# Patient Record
Sex: Male | Born: 1995 | Race: White | Hispanic: No | Marital: Single | Smoking: Never smoker
Health system: Southern US, Community
[De-identification: ages and names within clinical notes are randomized; demographics above are authoritative.]

## PROBLEM LIST (undated history)

## (undated) DIAGNOSIS — J301 Allergic rhinitis due to pollen: Secondary | ICD-10-CM

## (undated) HISTORY — DX: Allergic rhinitis due to pollen: J30.1

## (undated) HISTORY — PX: NO PAST SURGERIES: SHX2092

---
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
## 2014-09-21 IMAGING — CR DG FOOT COMPLETE 3+V*R*
3 series · 3 of 3 positions shown · non-contrast
Comparison: None.

CLINICAL DATA: Foot pain

RIGHT FOOT COMPLETE - 3+ VIEW

[view not recorded (1 of 3)]
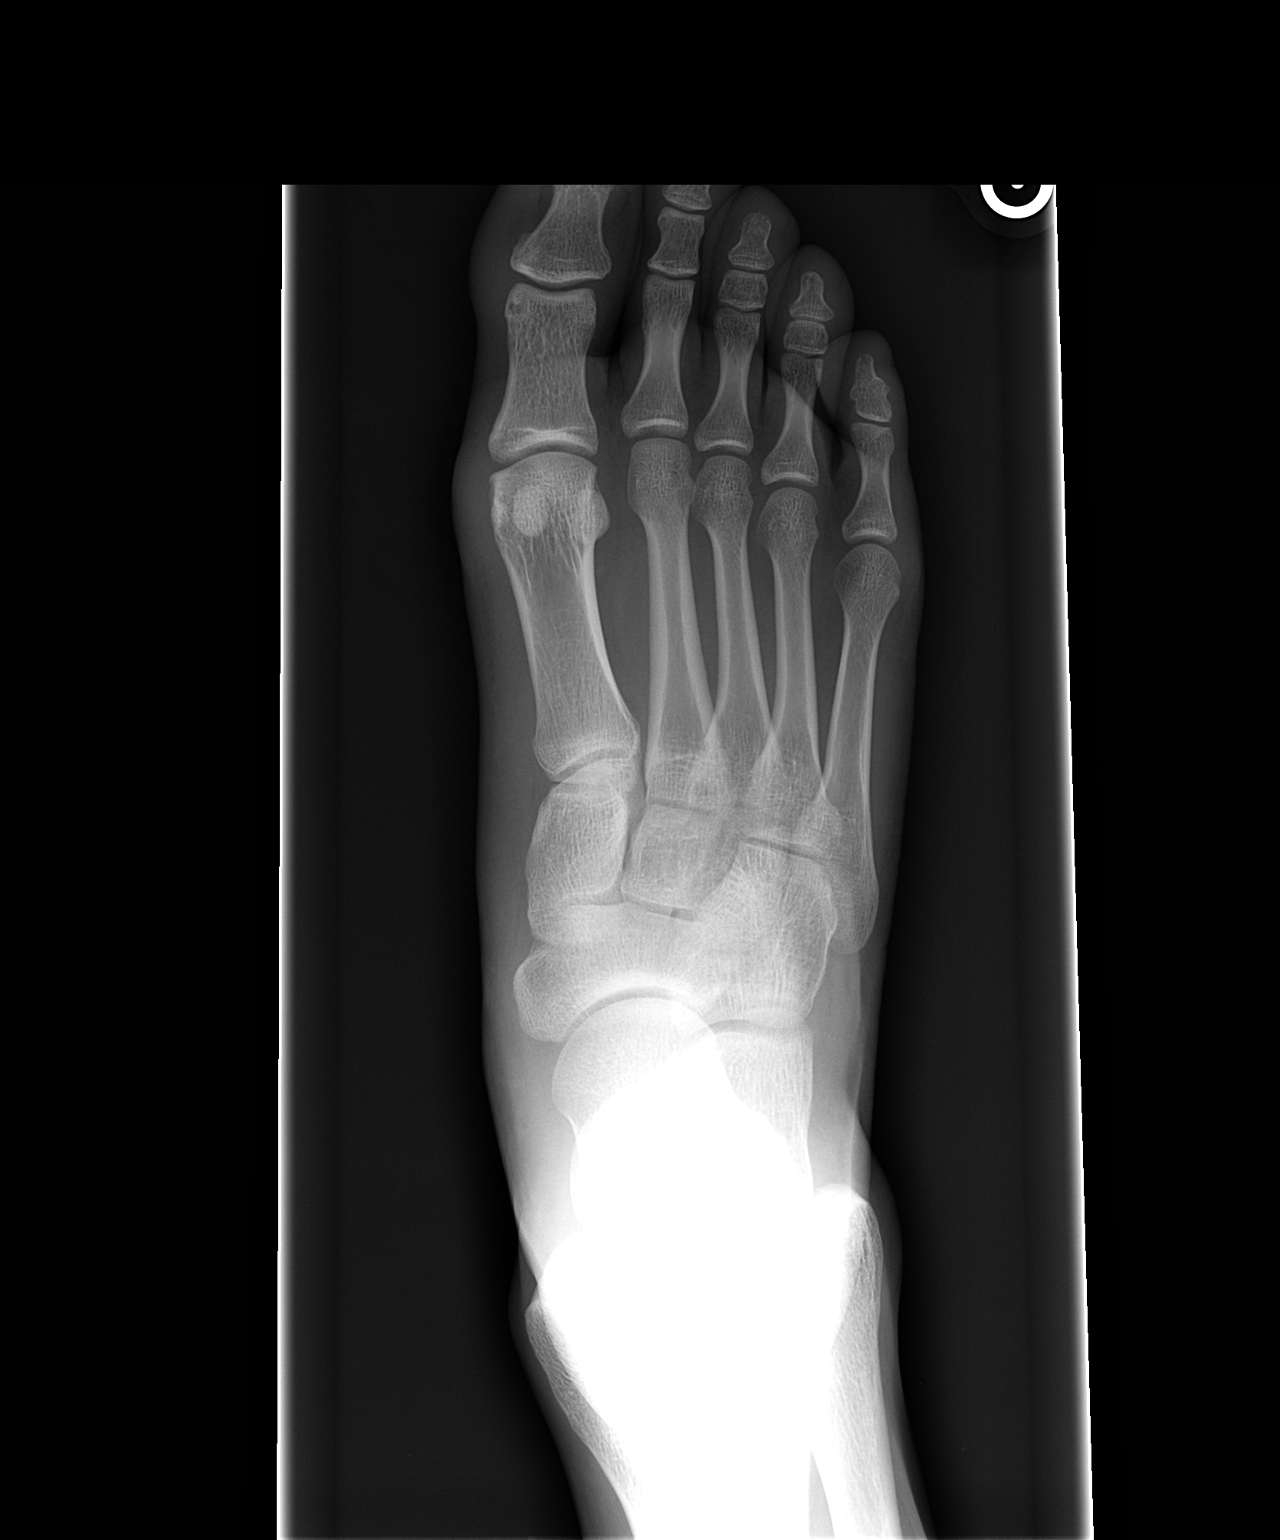

[view not recorded (2 of 3)]
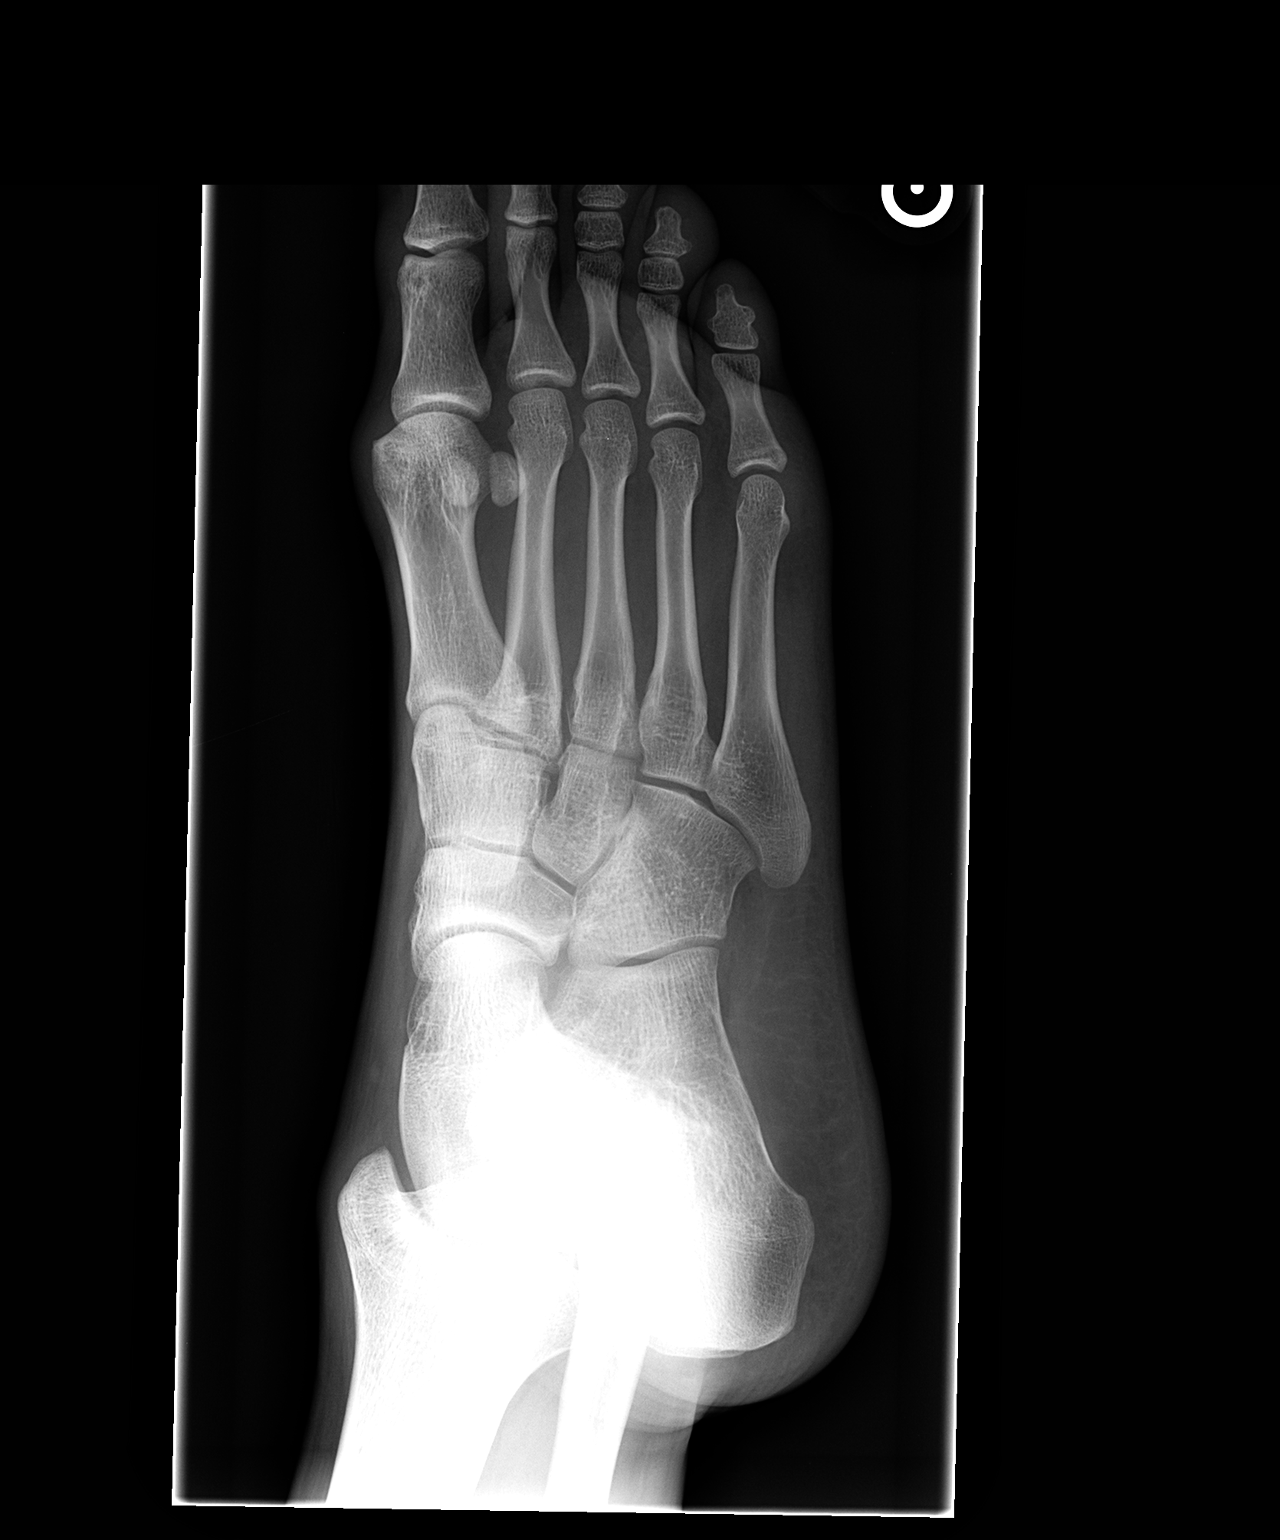

[view not recorded (3 of 3)]
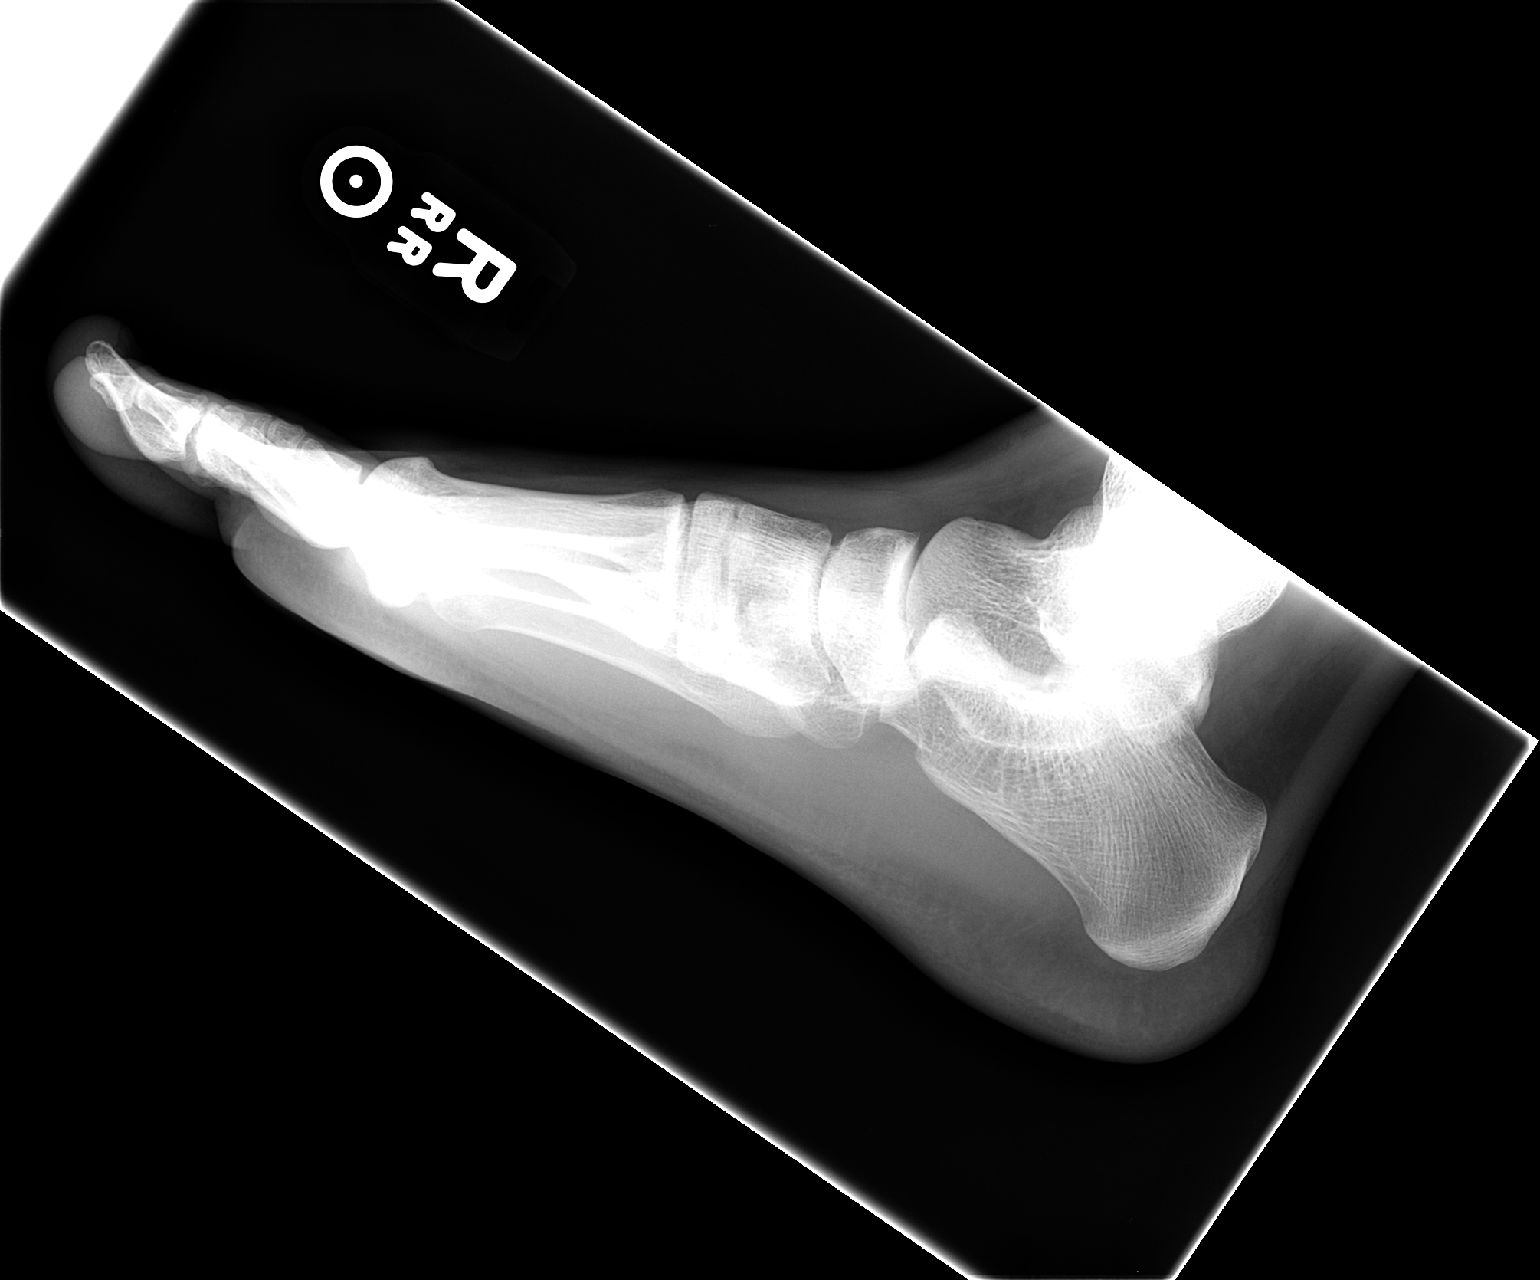

[3 of 3 positions shown; findings below may reference images not displayed]

FINDINGS: Tarsal - metatarsal alignment is normal.  No acute
fracture is seen.  Joint spaces appear normal.  Mild soft tissue
swelling is noted adjacent to the right first MTP joint medially.
IMPRESSION: No acute bony abnormality.  Mild soft tissue swelling medial to the
right first MTP joint.

## 2014-09-21 IMAGING — CR DG FINGER LITTLE 2+V*R*
1 series · 1 of 1 positions shown · non-contrast
Comparison: None.

CLINICAL DATA: Pain post trauma

[view not recorded]
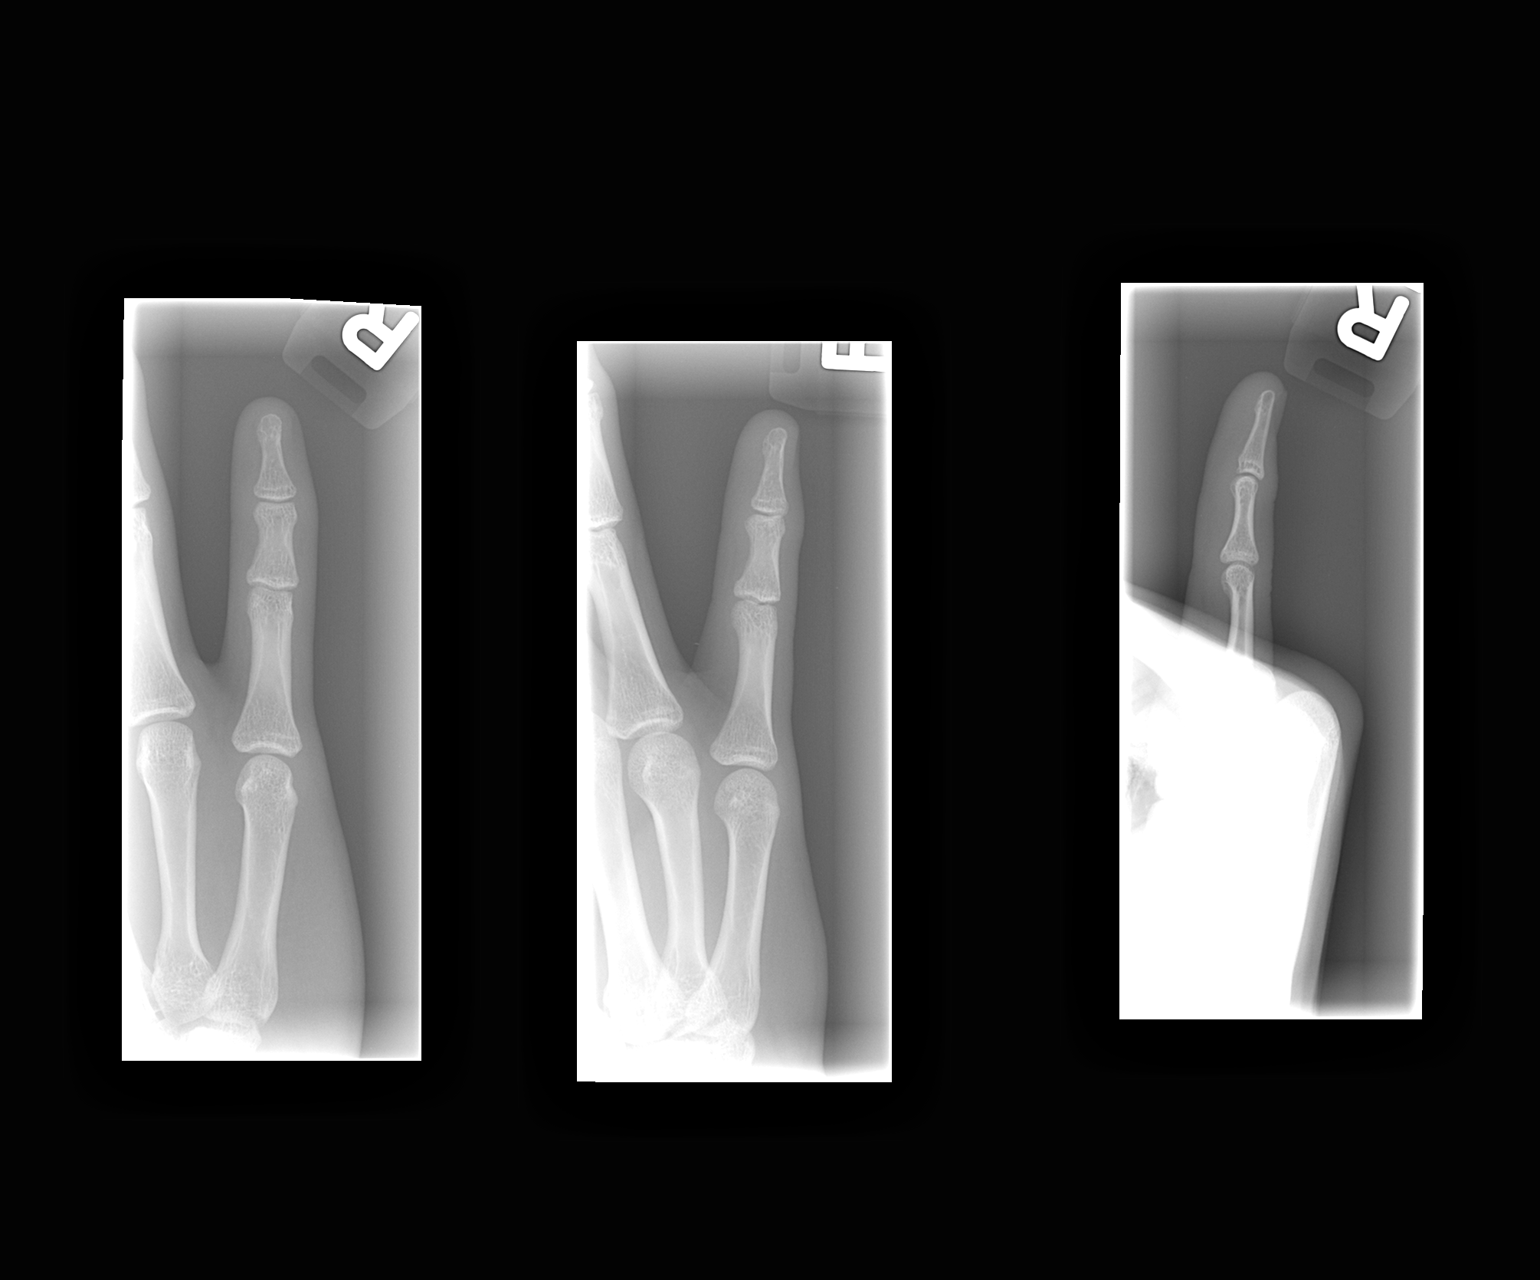

[1 of 1 positions shown; findings below may reference images not displayed]

FINDINGS: Frontal, oblique, and lateral views were obtained.  There
is no fracture or dislocation.  Joint spaces appear intact.  No
erosive change.
IMPRESSION: No abnormality noted.

## 2015-03-17 ENCOUNTER — Ambulatory Visit (INDEPENDENT_AMBULATORY_CARE_PROVIDER_SITE_OTHER): Payer: PRIVATE HEALTH INSURANCE | Admitting: Family Medicine

## 2015-03-17 ENCOUNTER — Encounter: Payer: Self-pay | Admitting: Family Medicine

## 2015-03-17 VITALS — BP 104/66 | HR 74 | Temp 98.5°F | Ht 66.5 in | Wt 147.0 lb

## 2015-03-17 DIAGNOSIS — J302 Other seasonal allergic rhinitis: Secondary | ICD-10-CM

## 2015-03-17 DIAGNOSIS — H6982 Other specified disorders of Eustachian tube, left ear: Secondary | ICD-10-CM

## 2015-03-17 DIAGNOSIS — H6980 Other specified disorders of Eustachian tube, unspecified ear: Secondary | ICD-10-CM | POA: Insufficient documentation

## 2015-03-17 NOTE — Patient Instructions (Signed)
Try nasal steroid spray; 2 sprays each nostril once every morning.  Try OTC nonsedating antihistamine like claritin, zyrtec, or allegra.  Decongestants not recommended.  Blow against closed nostrils forcefully several times per day or as needed.

## 2015-03-17 NOTE — Progress Notes (Addendum)
Office Note 03/17/2015  CC:  Chief Complaint  Patient presents with  . Establish Care   HPI:  Thomas Melton is a 19 y.o. White male who is here to establish care. Patient's most recent primary MD: [facility] [place]. Old records were not reviewed prior to or during today's visit.  Left ear feels full, like it needs to pop, started last night.  Hearing diminished slightly as well. Over the last week has had more nasal congestion/runny nose, sneezing, itchy eyes. No cough or wheezing.  No fevers.  No ear pain.  Denies hx of similar ear problem. Past meds tried for allergic rhin sx's: claritin  Past Medical History  Diagnosis Date  . Hay fever     Just spring usually    History reviewed. No pertinent past surgical history.  History reviewed. No pertinent family history.  History   Social History  . Marital Status: Single    Spouse Name: N/A  . Number of Children: N/A  . Years of Education: N/A   Occupational History  . Not on file.   Social History Main Topics  . Smoking status: Never Smoker   . Smokeless tobacco: Never Used  . Alcohol Use: No  . Drug Use: Not on file  . Sexual Activity: Not on file   Other Topics Concern  . Not on file   Social History Narrative   Single, no children.   [employer] grad 2015.   [employer] student.     Works part time for [employer] loading trucks.   No T/A/Ds.   MEDS: none except as mentioned in HPI  No Known Allergies  ROS Review of Systems  Constitutional: Negative for fever and fatigue.  HENT:       See HPI  Eyes: Negative for pain, discharge, redness and visual disturbance.  Respiratory: Negative for cough.   Cardiovascular: Negative for chest pain.  Gastrointestinal: Negative for nausea and abdominal pain.  Genitourinary: Negative for dysuria.  Musculoskeletal: Negative for back pain and joint swelling.  Skin: Negative for rash.  Neurological: Negative for weakness and headaches.  Hematological: Negative for  adenopathy.    PE; Blood pressure 104/66, pulse 74, temperature 98.5 F (36.9 C), temperature source Temporal, height 5' 6.5" (1.689 m), weight 147 lb (66.679 kg), SpO2 98 %. Gen: Alert, well appearing.  Patient is oriented to person, place, time, and situation. ENT: Ears: EACs clear, normal epithelium.  TMs with good light reflex and landmarks bilaterally.  Eyes: no injection, icteris, swelling, or exudate.  EOMI, PERRLA. Nose: no drainage or turbinate edema/swelling.  No injection or focal lesion.  Mouth: lips without lesion/swelling.  Oral mucosa pink and moist.  Dentition intact and without obvious caries or gingival swelling.  Oropharynx without erythema, exudate, or swelling.  Neck - No masses or thyromegaly or limitation in range of motion CV: RRR, no m/r/g.   LUNGS: CTA bilat, nonlabored resps, good aeration in all lung fields.  Pertinent labs:  none  ASSESSMENT AND PLAN:   New pt; obtain old shot records.  1) Eustachian tube dysfunction; secondary to his seasonal allergic rhinitis sx's of late. Discussed dx with pt, answered questions. Instructions: Try nasal steroid spray; 2 sprays each nostril once every morning (he says his dad has some at home so he declined a rx for any meds today).  Try OTC nonsedating antihistamine like claritin, zyrtec, or allegra.  Decongestants not recommended.  Blow against closed nostrils forcefully several times per day or as needed.  An After Visit Summary was printed and given to the patient.  F/u prn.

## 2015-03-17 NOTE — Progress Notes (Signed)
Pre visit review using our clinic review tool, if applicable. No additional management support is needed unless otherwise documented below in the visit note. 

## 2017-04-22 ENCOUNTER — Telehealth: Payer: Self-pay | Admitting: Family Medicine

## 2017-04-22 NOTE — Telephone Encounter (Signed)
Patient's mother calling on behalf of pt to request to switch pcp from [facility] to [facility].  Dr. Claiborne Billings sees other family members.

## 2017-04-22 NOTE — Telephone Encounter (Signed)
Ok with me 

## 2017-04-22 NOTE — Telephone Encounter (Signed)
OK with me.

## 2017-04-23 ENCOUNTER — Ambulatory Visit: Payer: PRIVATE HEALTH INSURANCE | Admitting: Family Medicine

## 2017-04-25 ENCOUNTER — Ambulatory Visit (INDEPENDENT_AMBULATORY_CARE_PROVIDER_SITE_OTHER): Payer: PRIVATE HEALTH INSURANCE | Admitting: Family Medicine

## 2017-04-25 ENCOUNTER — Encounter: Payer: Self-pay | Admitting: Family Medicine

## 2017-04-25 VITALS — BP 138/77 | HR 84 | Temp 97.8°F | Resp 20 | Ht 67.0 in | Wt 155.5 lb

## 2017-04-25 DIAGNOSIS — F418 Other specified anxiety disorders: Secondary | ICD-10-CM | POA: Diagnosis not present

## 2017-04-25 DIAGNOSIS — F39 Unspecified mood [affective] disorder: Secondary | ICD-10-CM | POA: Diagnosis not present

## 2017-04-25 MED ORDER — FLUOXETINE HCL 20 MG PO TABS
20.0000 mg | ORAL_TABLET | Freq: Every day | ORAL | 0 refills | Status: DC
Start: 2017-04-25 — End: 2017-07-19

## 2017-04-25 NOTE — Progress Notes (Signed)
Patient ID: Coda Mathey, male  DOB: May 30, 1996, 21 y.o.   MRN: [id number] Patient Care Team    Relationship Specialty Notifications Start End  Natalia Leatherwood, DO PCP - General Family Medicine  04/23/17     Chief Complaint  Patient presents with  . Anxiety    Subjective:  Eddrick Dilone is a 21 y.o.  male present for new patient establishment (transfer between providers). All past medical history, surgical history, allergies, family history, immunizations, medications and social history were obtained in the electronic medical record today. All recent labs, ED visits and hospitalizations within the last year were reviewed.  Anxiety: Patient reports he is having a hard time "shutting [his] brain off. "He is having a hard time finishing thoughts, concentrating and focusing. He feels over the last few month he started to have signs of depression. He reports he has always been mildly anxious, but never to this level, and never needed medication to help him. The depression symptoms are new for him. He is sometimes happy, but usually not feeling motivated, sad and down on himself. He just finished his junior year at [employer], studying business administration. He works at [employer] as well. He had roommates, but recently moved home with his parents. He reports it is a "better environment "for him. His grades have always been pretty good until this past year. He states his grades are slipping, and he started using marijuana and cocaine. He moved to have better support to quit all of the drugs. He started seeing a Veterinary surgeon. His family is supportive. He denies any known family history of psychiatric disorders. He denies SI or HI. He has difficulty sleeping, sometimes only having 3-6 hours of sleep a night.  Mood disorder screening: Positive #1- 12 checked yes. #2- yes #3- serious problem #4 and #5- he is uncertain  Depression screen PHQ 2/9 04/25/2017  Decreased Interest 2  Down, Depressed,  Hopeless 2  PHQ - 2 Score 4  Altered sleeping 2  Tired, decreased energy 3  Change in appetite 2  Feeling bad or failure about yourself  2  Trouble concentrating 3  Moving slowly or fidgety/restless 1  Suicidal thoughts 0  PHQ-9 Score 17  -Very difficult for him to do his work, take care of things at home are good along with other people. GAD 7 : Generalized Anxiety Score 04/25/2017  Nervous, Anxious, on Edge 2  Control/stop worrying 3  Worry too much - different things 3  Trouble relaxing 1  Restless 1  Easily annoyed or irritable 2  Afraid - awful might happen 2  Total GAD 7 Score 14  Anxiety Difficulty Very difficult  - very difficult for him to do his work take care of things at home are good along with people.   No flowsheet data found.  There is no immunization history on file for this patient.  No exam data present  Past Medical History:  Diagnosis Date  . Hay fever    Just spring usually   No Known Allergies Past Surgical History:  Procedure Laterality Date  . NO PAST SURGERIES     No family history on file. Social History   Social History  . Marital status: Single    Spouse name: N/A  . Number of children: N/A  . Years of education: N/A   Occupational History  . Not on file.   Social History Main Topics  . Smoking status: Never Smoker  . Smokeless tobacco:  Never Used  . Alcohol use No  . Drug use: Yes    Types: Marijuana, Cocaine  . Sexual activity: Yes   Other Topics Concern  . Not on file   Social History Narrative   Single, no children.   [employer] student      Allergies as of 04/25/2017   No Known Allergies     Medication List       Accurate as of 04/25/17 12:28 PM. Always use your most recent med list.          FLUoxetine 20 MG tablet Commonly known as:  PROZAC Take 1 tablet (20 mg total) by mouth daily.       All past medical history, surgical history, allergies, family history, immunizations andmedications were updated in the  EMR today and reviewed under the history and medication portions of their EMR.    No results found for this or any previous visit (from the past 2160 hour(s)).  No results found.   ROS: 14 pt review of systems performed and negative (unless mentioned in an HPI)  Objective: BP 138/77 (BP Location: Right Arm, Patient Position: Sitting, Cuff Size: Normal)   Pulse 84   Temp 97.8 F (36.6 C)   Resp 20   Ht 5\' 7"  (1.702 m)   Wt 155 lb 8 oz (70.5 kg)   SpO2 99%   BMI 24.35 kg/m  Gen: Afebrile. No acute distress. Nontoxic in appearance, well-developed, well-nourished HENT: AT. [place]. MMM Eyes:Pupils Equal Round Reactive to light, Extraocular movements intact,  Conjunctiva without redness, discharge or icterus. Neck/lymp/endocrine: Supple, no thyromegaly CV: RRR no murmur, no edema, +2/4 P posterior tibialis pulses.  Chest: CTAB, no wheeze, rhonchi or crackles.  Skin:  Warm and well-perfused. Skin intact. Neuro/Msk: Normal gait. PERLA. EOMi. Alert. Oriented x3.  Psych: Quiet, mildly withdrawn, sad appearing. Normal speech tone and speed. Normal thought content and judgment. No SI or HI.   Assessment/plan: Keane Scraperistan Michelotti is a 21 y.o. male present for anxiety. Depression with anxiety Mood disorder [facility](HCC) - Discussed with patient in great detail his depression screening is significant for major depression. His anxiety screening is significant for moderate anxiety. His mood disorder screening is positive. These results were discussed with him in detail. He was allowed to ask questions, all which were answered. She declined any blood work to look for organic causes. - Multiple options of therapies were discussed with patient and it was decided to start Prozac 20 mg daily. Continue his counseling. He was offered referral to psychiatry which she declined at this time. He was provided with resources for 24-hour crisis line, other counselors and psychiatrists in the community. - It sounds as if his  family is supportive for him. - Follow-up in 3-4 weeks prior to needing refills, will taper at that time if needed. May need to consider adding trazodone to help with sleep.  Return in about 3 weeks (around 05/16/2017). > 25 minutes spent with patient, >50% of time spent face to face counseling and/or coordinating care.     Note is dictated utilizing voice recognition software. Although note has been proof read prior to signing, occasional typographical errors still can be missed. If any questions arise, please do not hesitate to call for verification.  Electronically signed by: Felix Pacinienee Rebekka Lobello, DO [facility] [place]

## 2017-04-25 NOTE — Patient Instructions (Addendum)
It was a pleasure meeting you today.   Start medicine today. Take in the morning routinely. Must be taken everyday.   Follow up in 3.5 weeks, before needing refills so we can talk again and see if we need to increase your dose.  Try to make a routine sleep schedule for yourself.   Continue counseling.     Major Depressive Disorder, Adult Major depressive disorder (MDD) is a mental health condition. MDD often makes you feel sad, hopeless, or helpless. MDD can also cause symptoms in your body. MDD can affect your:  Work.  School.  Relationships.  Other normal activities. MDD can range from mild to very bad. It may occur once (single episode MDD). It can also occur many times (recurrent MDD). The main symptoms of MDD often include:  Feeling sad, depressed, or irritable most of the time.  Loss of interest. MDD symptoms also include:  Sleeping too much or too little.  Eating too much or too little.  A change in your weight.  Feeling tired (fatigue) or having low energy.  Feeling worthless.  Feeling guilty.  Trouble making decisions.  Trouble thinking clearly.  Thoughts of suicide or harming others.  Feeling weak.  Feeling agitated.  Keeping yourself from being around other people (isolation). Follow these instructions at home: Activity   Do these things as told by your doctor:  Go back to your normal activities.  Exercise regularly.  Spend time outdoors. Alcohol   Talk with your doctor about how alcohol can affect your antidepressant medicines.  Do not drink alcohol. Or, limit how much alcohol you drink.  This means no more than 1 drink a day for nonpregnant women and 2 drinks a day for men. One drink equals one of these:  12 oz of beer.  5 oz of wine.  1 oz of hard liquor. General instructions   Take over-the-counter and prescription medicines only as told by your doctor.  Eat a healthy diet.  Get plenty of sleep.  Find activities that  you enjoy. Make time to do them.  Think about joining a support group. Your doctor may be able to suggest a group for you.  Keep all follow-up visits as told by your doctor. This is important. Where to find more information:   The [employer] Alliance on Mental Illness:  www.nami.org  U.S. [employer] Institute of Mental Health:  http://www.maynard.net/www.nimh.nih.gov  National Suicide Prevention Lifeline:  424-615-77421-8204100626. This is free, 24-hour help. Contact a doctor if:  Your symptoms get worse.  You have new symptoms. Get help right away if:  You self-harm.  You see, hear, taste, smell, or feel things that are not present (hallucinate). If you ever feel like you may hurt yourself or others, or have thoughts about taking your own life, get help right away. You can go to your nearest emergency department or call:  Your local emergency services (911 in the [place]  A suicide crisis helpline, such as the National Suicide Prevention Lifeline:  205-541-50671-8204100626. This is open 24 hours a day. This information is not intended to replace advice given to you by your health care provider. Make sure you discuss any questions you have with your health care provider. Document Released: 11/21/2015 Document Revised: 08/26/2016 Document Reviewed: 08/26/2016 Elsevier Interactive Patient Education  2017 [employer]

## 2017-05-31 ENCOUNTER — Ambulatory Visit: Payer: PRIVATE HEALTH INSURANCE | Admitting: Family Medicine

## 2017-07-19 ENCOUNTER — Telehealth: Payer: Self-pay | Admitting: Family Medicine

## 2017-07-19 MED ORDER — FLUOXETINE HCL 20 MG PO TABS: 20.0000 mg | ORAL_TABLET | Freq: Every day | ORAL | 0 refills | Status: DC

## 2017-07-19 NOTE — Telephone Encounter (Signed)
Patient was due for follow up around 05/16/17 and would have been out of medication at the end of May. Mother is requesting we refill this medication since he has been out of it and he cant be seen until Dr Claiborne BillingsKuneff back from vacation. Please advise

## 2017-07-19 NOTE — Telephone Encounter (Signed)
Please call pt: - he was asked to follow-up in 3-4 weeks, which would have been end of June. He has been out of this medication for 4 weeks. - I will call in 1 month again, because he really needs to be on this medication consecutively to understand if it is beneficial to him and if we need to alter the dose.  - I will only provide this courtesy once, he will need to follow up to safely keep administrating the medication

## 2017-07-19 NOTE — Telephone Encounter (Signed)
Spoke with patient mother she states patient had been on the medication and has only been out for 3 days [facility] but does not want to go back there. She wants to keep appt here 07/29/17 she states [facility] had increased his prozac to 40 mg daily but we had only Rx'd him at 20 mg daily so she will keep appt for evaluation .

## 2017-07-19 NOTE — Telephone Encounter (Signed)
Patient mother calling to schedule patient's follow-up appt that was not schedule before leaving the office during last ov.  Follow-up appt scheduled for 07/29/17, which is first available due to pcp being out of the office the week of 07/22/17.  Patient's mother is concerned with patient waiting so long to be seen since he has ran out of medication.  She reports patient has seen another provider [facility].  However, he does not want to see this provider again but rather prefers to see Dr. Claiborne BillingsKuneff.    She wants to know if patient can get refill of medication today or does this need to wait until appt on 8/6.

## 2017-07-29 ENCOUNTER — Encounter: Payer: Self-pay | Admitting: Family Medicine

## 2017-07-29 ENCOUNTER — Ambulatory Visit (INDEPENDENT_AMBULATORY_CARE_PROVIDER_SITE_OTHER): Payer: PRIVATE HEALTH INSURANCE | Admitting: Family Medicine

## 2017-07-29 VITALS — BP 123/77 | HR 56 | Temp 97.7°F | Resp 16 | Wt 157.0 lb

## 2017-07-29 DIAGNOSIS — F39 Unspecified mood [affective] disorder: Secondary | ICD-10-CM | POA: Diagnosis not present

## 2017-07-29 DIAGNOSIS — F418 Other specified anxiety disorders: Secondary | ICD-10-CM

## 2017-07-29 MED ORDER — FLUOXETINE HCL 40 MG PO CAPS: 40.0000 mg | ORAL_CAPSULE | Freq: Two times a day (BID) | ORAL | 0 refills | Status: DC

## 2017-07-29 NOTE — Patient Instructions (Signed)
As long as you are doing well, I will see you in 3 months BEFORE you run out of meds.     Generalized Anxiety Disorder, Adult Generalized anxiety disorder (GAD) is a mental health disorder. People with this condition constantly worry about everyday events. Unlike normal anxiety, worry related to GAD is not triggered by a specific event. These worries also do not fade or get better with time. GAD interferes with life functions, including relationships, work, and school. GAD can vary from mild to severe. People with severe GAD can have intense waves of anxiety with physical symptoms (panic attacks). What are the causes? The exact cause of GAD is not known. What increases the risk? This condition is more likely to develop in:  Women.  People who have a family history of anxiety disorders.  People who are very shy.  People who experience very stressful life events, such as the death of a loved one.  People who have a very stressful family environment.  What are the signs or symptoms? People with GAD often worry excessively about many things in their lives, such as their health and family. They may also be overly concerned about:  Doing well at work.  Being on time.  Natural disasters.  Friendships.  Physical symptoms of GAD include:  Fatigue.  Muscle tension or having muscle twitches.  Trembling or feeling shaky.  Being easily startled.  Feeling like your heart is pounding or racing.  Feeling out of breath or like you cannot take a deep breath.  Having trouble falling asleep or staying asleep.  Sweating.  Nausea, diarrhea, or irritable bowel syndrome (IBS).  Headaches.  Trouble concentrating or remembering facts.  Restlessness.  Irritability.  How is this diagnosed? Your health care provider can diagnose GAD based on your symptoms and medical history. You will also have a physical exam. The health care provider will ask specific questions about your symptoms,  including how severe they are, when they started, and if they come and go. Your health care provider may ask you about your use of alcohol or drugs, including prescription medicines. Your health care provider may refer you to a mental health specialist for further evaluation. Your health care provider will do a thorough examination and may perform additional tests to rule out other possible causes of your symptoms. To be diagnosed with GAD, a person must have anxiety that:  Is out of his or her control.  Affects several different aspects of his or her life, such as work and relationships.  Causes distress that makes him or her unable to take part in normal activities.  Includes at least three physical symptoms of GAD, such as restlessness, fatigue, trouble concentrating, irritability, muscle tension, or sleep problems.  Before your health care provider can confirm a diagnosis of GAD, these symptoms must be present more days than they are not, and they must last for six months or longer. How is this treated? The following therapies are usually used to treat GAD:  Medicine. Antidepressant medicine is usually prescribed for long-term daily control. Antianxiety medicines may be added in severe cases, especially when panic attacks occur.  Talk therapy (psychotherapy). Certain types of talk therapy can be helpful in treating GAD by providing support, education, and guidance. Options include: ? Cognitive behavioral therapy (CBT). People learn coping skills and techniques to ease their anxiety. They learn to identify unrealistic or negative thoughts and behaviors and to replace them with positive ones. ? Acceptance and commitment therapy (ACT). This  treatment teaches people how to be mindful as a way to cope with unwanted thoughts and feelings. ? Biofeedback. This process trains you to manage your body's response (physiological response) through breathing techniques and relaxation methods. You will work  with a therapist while machines are used to monitor your physical symptoms.  Stress management techniques. These include yoga, meditation, and exercise.  A mental health specialist can help determine which treatment is best for you. Some people see improvement with one type of therapy. However, other people require a combination of therapies. Follow these instructions at home:  Take over-the-counter and prescription medicines only as told by your health care provider.  Try to maintain a normal routine.  Try to anticipate stressful situations and allow extra time to manage them.  Practice any stress management or self-calming techniques as taught by your health care provider.  Do not punish yourself for setbacks or for not making progress.  Try to recognize your accomplishments, even if they are small.  Keep all follow-up visits as told by your health care provider. This is important. Contact a health care provider if:  Your symptoms do not get better.  Your symptoms get worse.  You have signs of depression, such as: ? A persistently sad, cranky, or irritable mood. ? Loss of enjoyment in activities that used to bring you joy. ? Change in weight or eating. ? Changes in sleeping habits. ? Avoiding friends or family members. ? Loss of energy for normal tasks. ? Feelings of guilt or worthlessness. Get help right away if:  You have serious thoughts about hurting yourself or others. If you ever feel like you may hurt yourself or others, or have thoughts about taking your own life, get help right away. You can go to your nearest emergency department or call:  Your local emergency services (911 in the [place]  A suicide crisis helpline, such as the National Suicide Prevention Lifeline at (315)807-4944. This is open 24 hours a day.  Summary  Generalized anxiety disorder (GAD) is a mental health disorder that involves worry that is not triggered by a specific event.  People with  GAD often worry excessively about many things in their lives, such as their health and family.  GAD may cause physical symptoms such as restlessness, trouble concentrating, sleep problems, frequent sweating, nausea, diarrhea, headaches, and trembling or muscle twitching.  A mental health specialist can help determine which treatment is best for you. Some people see improvement with one type of therapy. However, other people require a combination of therapies. This information is not intended to replace advice given to you by your health care provider. Make sure you discuss any questions you have with your health care provider. Document Released: 04/06/2013 Document Revised: 10/30/2016 Document Reviewed: 10/30/2016 Elsevier Interactive Patient Education  [employer].

## 2017-07-29 NOTE — Progress Notes (Signed)
Patient ID: Thomas Melton, male  DOB: 08/29/1996, 21 y.o.   MRN: [id number] Patient Care Team    Relationship Specialty Notifications Start End  Natalia LeatherwoodKuneff, Emmeline Winebarger A, DO PCP - General Family Medicine  04/23/17     Chief Complaint  Patient presents with  . Depression    anxiety follow up/ Refills    Subjective:  Thomas Melton is a 21 y.o.  male present for follow up on anxiety  Anxiety: Pt presents for follow up on anxiety today. He did not follow was scheduled here as requested after last appt,  because [facility]. His prozac was increased to prozac 40 mg BID. He feels he is doing rather well on this dose. Anxiety is more controlled, sleeping better. He denies depression feelings expressed at last visit. He doe snot desire to go back to psych. He is planning to return to school in a few weeks, but will live with his parents. He is continuing to speak with counseling intermittently (family friend).    Prior note: Patient reports he is having a hard time "shutting [his] brain off. "He is having a hard time finishing thoughts, concentrating and focusing. He feels over the last few month he started to have signs of depression. He reports he has always been mildly anxious, but never to this level, and never needed medication to help him. The depression symptoms are new for him. He is sometimes happy, but usually not feeling motivated, sad and down on himself. 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 He works at [employer] as well. He had roommates, but recently moved home with his parents. He reports it is a "better environment "for him. His grades have always been pretty good until this past year. He states his grades are slipping, and he started using marijuana and cocaine. He moved to have better support to quit all of the drugs. He started seeing a Veterinary surgeoncounselor. His family is supportive. He denies any known family history of psychiatric  disorders. He denies SI or HI. He has difficulty sleeping, sometimes only having 3-6 hours of sleep a night.  Mood disorder screening 04/25/2017: Positive #1- 12 checked yes. #2- yes #3- serious problem #4 and #5- he is uncertain  Depression screen [facility] 2/9 07/29/2017 04/25/2017  Decreased Interest 0 2  Down, Depressed, Hopeless 0 2  PHQ - 2 Score 0 4  Altered sleeping 2 2  Tired, decreased energy 1 3  Change in appetite 0 2  Feeling bad or failure about yourself  0 2  Trouble concentrating 1 3  Moving slowly or fidgety/restless 0 1  Suicidal thoughts 0 0  PHQ-9 Score 4 17   GAD 7 : Generalized Anxiety Score 04/25/2017  Nervous, Anxious, on Edge 2  Control/stop worrying 3  Worry too much - different things 3  Trouble relaxing 1  Restless 1  Easily annoyed or irritable 2  Afraid - awful might happen 2  Total GAD 7 Score 14  Anxiety Difficulty Very difficult     No flowsheet data found.  There is no immunization history on file for this patient.  No exam data present  Past Medical History:  Diagnosis Date  . Hay fever    Just spring usually   No Known Allergies Past Surgical History:  Procedure Laterality Date  . NO PAST SURGERIES     No family history on file. Social History   Social History  .  Marital status: Single    Spouse name: N/A  . Number of children: N/A  . Years of education: 45   Occupational History  . student    Social History Main Topics  . Smoking status: Never Smoker  . Smokeless tobacco: Never Used  . Alcohol use No  . Drug use: Yes    Types: Marijuana, Cocaine  . Sexual activity: Yes   Other Topics Concern  . Not on file   Social History Narrative   Single, no children.   [employer], arts.         Allergies as of 07/29/2017   No Known Allergies     Medication List       Accurate as of 07/29/17  1:11 PM. Always use your most recent med list.          FLUoxetine 40 MG capsule Commonly known as:   PROZAC Take 1 capsule (40 mg total) by mouth 2 (two) times daily.       All past medical history, surgical history, allergies, family history, immunizations andmedications were updated in the EMR today and reviewed under the history and medication portions of their EMR.    No results found for this or any previous visit (from the past 2160 hour(s)).  No results found.   ROS: 14 pt review of systems performed and negative (unless mentioned in an HPI)  Objective: BP 123/77 (BP Location: Left Arm, Patient Position: Sitting, Cuff Size: Normal)   Pulse (!) 56   Temp 97.7 F (36.5 C) (Oral)   Resp 16   Wt 157 lb (71.2 kg)   SpO2 98%   BMI 24.59 kg/m  Gen: Afebrile. No acute distress. Pleasant. Appears happy.  Psych: Normal affect, dress and demeanor. Normal speech. Normal thought content and judgment.   Assessment/plan: Thomas Melton is a 21 y.o. male present for anxiety. Depression with anxiety Mood disorder (HCC) - over all doing much better - continue prozac at 40 mg BID.  - Continue counseling.  - F/u 3 months, unless needed sooner.   Return in about 3 months (around 10/29/2017) for depression/anxiety.    Note is dictated utilizing voice recognition software. Although note has been proof read prior to signing, occasional typographical errors still can be missed. If any questions arise, please do not hesitate to call for verification.  Electronically signed by: Felix Pacini, DO [facility]

## 2017-12-12 ENCOUNTER — Telehealth: Payer: Self-pay | Admitting: Family Medicine

## 2017-12-12 MED ORDER — FLUOXETINE HCL 40 MG PO CAPS
40.0000 mg | ORAL_CAPSULE | Freq: Two times a day (BID) | ORAL | 0 refills | Status: DC
Start: 2017-12-12 — End: 2017-12-27

## 2017-12-12 NOTE — Telephone Encounter (Signed)
Copied from CRM [id number]. Topic: Quick Communication - Rx Refill/Question >> Dec 12, 2017 12:36 PM Viviann SpareWhite, Selina wrote: Has the patient contacted their pharmacy? Yes.   FLUoxetine (PROZAC) 40 MG capsule (PATIENT IS TOTALLY OUT OF HIS MED)  (Agent: If no, request that the patient contact the pharmacy for the refill.)  [id number] [address] [address] [place] [address] Phone: [phone] Fax: [phone]  [facility] (with phone number or street name):   Agent: Please be advised that RX refills may take up to 3 business days. We ask that you follow-up with your pharmacy.

## 2017-12-12 NOTE — Telephone Encounter (Signed)
Phone call returned to pt's mother.  Advised pt. Is overdue for a 3 mo. f/u with Felix Pacinienee Kuneff, based on last appt. Note from Aug. 2018.  Advised will give a 30 day supply and then will need to schedule the f/u appt.  Appt. Given for 12/27/17 @ 9:15 AM.  Mother verb. understanding; agrees with plan.

## 2017-12-27 ENCOUNTER — Ambulatory Visit: Payer: PRIVATE HEALTH INSURANCE | Admitting: Family Medicine

## 2017-12-27 ENCOUNTER — Encounter: Payer: Self-pay | Admitting: Family Medicine

## 2017-12-27 VITALS — BP 126/75 | HR 63 | Temp 97.7°F | Resp 20 | Ht 67.0 in | Wt 156.0 lb

## 2017-12-27 DIAGNOSIS — F418 Other specified anxiety disorders: Secondary | ICD-10-CM | POA: Diagnosis not present

## 2017-12-27 DIAGNOSIS — W57XXXA Bitten or stung by nonvenomous insect and other nonvenomous arthropods, initial encounter: Secondary | ICD-10-CM

## 2017-12-27 DIAGNOSIS — F39 Unspecified mood [affective] disorder: Secondary | ICD-10-CM

## 2017-12-27 MED ORDER — FLUOXETINE HCL 40 MG PO CAPS: 40.0000 mg | ORAL_CAPSULE | Freq: Two times a day (BID) | ORAL | 1 refills | Status: DC

## 2017-12-27 NOTE — Progress Notes (Signed)
Patient ID: Thomas Melton, male  DOB: 06-03-1996, 22 y.o.   MRN: [id number] Patient Care Team    Relationship Specialty Notifications Start End  Natalia Leatherwood, DO PCP - General Family Medicine  04/23/17     Chief Complaint  Patient presents with  . Depression  . Anxiety    Subjective:  Thomas Melton is a 22 y.o.  male present for follow up on anxiety  Depression-Anxiety/mood disorder: Pt reports he feels great. His mood is well controlled on Prozac.He states it is amazing how well he feels now that he has compliance with his medications. No side effects.    Prior note:  Pt presents for follow up on anxiety today. He did not follow was scheduled here as requested after last appt,  because he was seen at [facility]. His prozac was increased to prozac 40 mg BID. He feels he is doing rather well on this dose. Anxiety is more controlled, sleeping better. He denies depression feelings expressed at last visit. He doe snot desire to go back to psych. He is planning to return to school in a few weeks, but will live with his parents. He is continuing to speak with counseling intermittently (family friend).    Prior note: Patient reports he is having a hard time "shutting [his] brain off. "He is having a hard time finishing thoughts, concentrating and focusing. He feels over the last few month he started to have signs of depression. He reports he has always been mildly anxious, but never to this level, and never needed medication to help him. The depression symptoms are new for him. He is sometimes happy, but usually not feeling motivated, sad and down on himself. He just finished his junior year at [employer], studying business administration. He works at [employer] as well. He had roommates, but recently moved home with his parents. He reports it is a "better environment "for him. His grades have always been pretty good until this past year. He states his grades are slipping, and he started using  marijuana and cocaine. He moved to have better support to quit all of the drugs. He started seeing a Veterinary surgeon. His family is supportive. He denies any known family history of psychiatric disorders. He denies SI or HI. He has difficulty sleeping, sometimes only having 3-6 hours of sleep a night.  Mood disorder screening 04/25/2017: Positive #1- 12 checked yes. #2- yes #3- serious problem #4 and #5- he is uncertain  Depression screen [facility] 2/9 12/27/2017 07/29/2017 04/25/2017  Decreased Interest 0 0 2  Down, Depressed, Hopeless 0 0 2  PHQ - 2 Score 0 0 4  Altered sleeping 0 2 2  Tired, decreased energy 0 1 3  Change in appetite 0 0 2  Feeling bad or failure about yourself  0 0 2  Trouble concentrating 1 1 3   Moving slowly or fidgety/restless 0 0 1  Suicidal thoughts 0 0 0  PHQ-9 Score 1 4 17    GAD 7 : Generalized Anxiety Score 12/27/2017 04/25/2017  Nervous, Anxious, on Edge 0 2  Control/stop worrying 0 3  Worry too much - different things 0 3  Trouble relaxing 0 1  Restless 0 1  Easily annoyed or irritable 0 2  Afraid - awful might happen 0 2  Total GAD 7 Score 0 14  Anxiety Difficulty Not difficult at all Very difficult     No flowsheet data found.  There is no immunization history on file  for this patient.  No exam data present  Past Medical History:  Diagnosis Date  . Hay fever    Just spring usually   No Known Allergies Past Surgical History:  Procedure Laterality Date  . NO PAST SURGERIES     History reviewed. No pertinent family history. Social History   Socioeconomic History  . Marital status: Single    Spouse name: Not on file  . Number of children: Not on file  . Years of education: 9114  . Highest education level: Not on file  Social Needs  . Financial resource strain: Not on file  . Food insecurity - worry: Not on file  . Food insecurity - inability: Not on file  . Transportation needs - medical: Not on file  . Transportation needs - non-medical: Not on file    Occupational History  . Occupation: Consulting civil engineerstudent  Tobacco Use  . Smoking status: Never Smoker  . Smokeless tobacco: Never Used  Substance and Sexual Activity  . Alcohol use: No    Alcohol/week: 0.0 oz  . Drug use: Yes    Types: Marijuana, Cocaine  . Sexual activity: Yes  Other Topics Concern  . Not on file  Social History Narrative   Single, no children.   [employer], arts[employer] studying business management.      Allergies as of 12/27/2017   No Known Allergies     Medication List        Accurate as of 12/27/17  9:49 AM. Always use your most recent med list.          FLUoxetine 40 MG capsule Commonly known as:  PROZAC Take 1 capsule (40 mg total) by mouth 2 (two) times daily.       All past medical history, surgical history, allergies, family history, immunizations andmedications were updated in the EMR today and reviewed under the history and medication portions of their EMR.    No results found for this or any previous visit (from the past 2160 hour(s)).  No results found.   ROS: 14 pt review of systems performed and negative (unless mentioned in an HPI)  Objective: BP 126/75 (BP Location: Left Arm, Patient Position: Sitting, Cuff Size: Large)   Pulse 63   Temp 97.7 F (36.5 C)   Resp 20   Ht 5\' 7"  (1.702 m)   Wt 156 lb (70.8 kg)   SpO2 99%   BMI 24.43 kg/m  Gen: Afebrile. No acute distress. Nontoxic. Pleasant caucasian male.  HENT: AT. [place].MMM.  Eyes:Pupils Equal Round Reactive to light, Extraocular movements intact,  Conjunctiva without redness, discharge or icterus. Skin: no rashes, purpura or petechiae. Multiple small insect bites bilateral lower ext mid calf to ankle. No drainage Psych: Normal affect, dress and demeanor. Normal speech. Normal thought content and judgment.  Assessment/plan: Thomas Melton is a 22 y.o. male present for anxiety. Depression with anxiety Mood disorder (HCC) - Doing great on high dose prozac.  - continue prozac at 40 mg BID, refills  provided today for 6 months.   - Continue counseling.  - F/u 6 months, unless needed sooner.   Bug bite, initial encounter - appear to be flea bites. Sig other with same and they have a cat.  - OTC hydrocortisone cream recommended, keep clean. Treat cat/house for fleas.   Return in about 6 months (around 06/26/2018).  Note is dictated utilizing voice recognition software. Although note has been proof read prior to signing, occasional typographical errors still can be missed. If any questions  arise, please do not hesitate to call for verification.  Electronically signed by: Howard Pouch, DO [place]

## 2017-12-27 NOTE — Patient Instructions (Addendum)
I am glad you are doing so well.  I have refilled medicines today, followup around 6 months before you run out of medicine.   Please help us help you:  We are honored [facility] have [facility]. Below you will find basic instructions that you may need to access in the future. Please help us help you by reading the instructions, which cover many of the frequent questions we experience.   Prescription refills and request:  -In order to allow more efficient response time, please call your pharmacy for all refills. They will forward the request electronically to us. This allows for the quickest possible response. Request left on a nurse line can take longer to refill, since these are checked as time allows between office patients and other phone calls.  - refill request can take up to 3-5 working days to complete.  - If request is sent electronically and request is appropiate, it is usually completed in 1-2 business days.  - all patients will need to be seen routinely for all chronic medical conditions requiring prescription medications (see follow-up below). If you are overdue for follow up on your condition, you will be asked to make an appointment and we will call in enough medication to cover you until your appointment (up to 30 days).  - all controlled substances will require a face to face visit to request/refill.  - if you desire your prescriptions to go through a new pharmacy, and have an active script at original pharmacy, you will need to call your pharmacy and have scripts transferred to new pharmacy. This is completed between the pharmacy locations and not by your provider.    Results: If any images or labs were ordered, it can take up to 1 week to get results depending on the test ordered and the lab/facility running and resulting the test. - Normal or stable results, which do not need further discussion, may be released to your mychart immediately with  attached note to you. A call may not be generated for normal results. Please make certain to sign up for mychart. If you have questions on how to activate your mychart you can call the front office.  - If your results need further discussion, our office will attempt to contact you via phone, and if unable to reach you after 2 attempts, we will release your abnormal result to your mychart with instructions.  - All results will be automatically released in mychart after 1 week.  - Your provider will provide you with explanation and instruction on all relevant material in your results. Please keep in mind, results and labs may appear confusing or abnormal to the untrained eye, but it does not mean they are actually abnormal for you personally. If you have any questions about your results that are not covered, or you desire more detailed explanation than what was provided, you should make an appointment with your provider to do so.   Our office handles many outgoing and incoming calls daily. If we have not contacted you within 1 week about your results, please check your mychart to see if there is a message first and if not, then contact our office.  In helping with this matter, you help decrease call volume, and therefore allow us to be able to respond to patients needs more efficiently.   Acute office visits (sick visit):  An acute visit is intended for a new problem and are scheduled in shorter time  slots to allow schedule openings for patients with new problems. This is the appropriate visit to discuss a new problem. In order to provide you with excellent quality medical care with proper time for you to explain your problem, have an exam and receive treatment with instructions, these appointments should be limited to one new problem per visit. If you experience a new problem, in which you desire to be addressed, please make an acute office visit, we save openings on the schedule to accommodate you. Please do  not save your new problem for any other type of visit, let us take care of it properly and quickly for you.   Follow up visits:  Depending on your condition(s) your provider will need to see you routinely in order to provide you with quality care and prescribe medication(s). Most chronic conditions (Example: hypertension, Diabetes, depression/anxiety... etc), require visits a couple times a year. Your provider will instruct you on proper follow up for your personal medical conditions and history. Please make certain to make follow up appointments for your condition as instructed. Failing to do so could result in lapse in your medication treatment/refills. If you request a refill, and are overdue to be seen on a condition, we will always provide you with a 30 day script (once) to allow you time to schedule.    Medicare wellness (well visit): - we have a wonderful Nurse Selena Batten), that will meet with you and provide you will yearly medicare wellness visits. These visits should occur yearly (can not be scheduled less than 1 calendar year apart) and cover preventive health, immunizations, advance directives and screenings you are entitled to yearly through your medicare benefits. Do not miss out on your entitled benefits, this is when medicare will pay for these benefits to be ordered for you.  These are strongly encouraged by your provider and is the appropriate type of visit to make certain you are up to date with all preventive health benefits. If you have not had your medicare wellness exam in the last 12 months, please make certain to schedule one by calling the office and schedule your medicare wellness with Selena Batten as soon as possible.   Yearly physical (well visit):  - Adults are recommended to be seen yearly for physicals. Check with your insurance and date of your last physical, most insurances require one calendar year between physicals. Physicals include all preventive health topics, screenings, medical  exam and labs that are appropriate for gender/age and history. You may have fasting labs needed at this visit. This is a well visit (not a sick visit), new problems should not be covered during this visit (see acute visit).  - Pediatric patients are seen more frequently when they are younger. Your provider will advise you on well child visit timing that is appropriate for your their age. - This is not a medicare wellness visit. Medicare wellness exams do not have an exam portion to the visit. Some medicare companies allow for a physical, some do not allow a yearly physical. If your medicare allows a yearly physical you can schedule the medicare wellness with our nurse Selena Batten and have your physical with your provider after, on the same day. Please check with insurance for your full benefits.   Late Policy/No Shows:  - all new patients should arrive 15-30 minutes earlier than appointment to allow [place] time  to  obtain all personal demographics,  insurance information and for you to complete office paperwork. - All established patients should arrive  10-15 minutes earlier than appointment time to update all information and be checked in .  - In our best efforts to run on time, if you are late for your appointment you will be asked to either reschedule or if able, we will work you back into the schedule. There will be a wait time to work you back in the schedule,  depending on availability.  - If you are unable to make it to your appointment as scheduled, please call 24 hours ahead of time to allow [place] to fill the time slot with someone else who needs to be seen. If you do not cancel your appointment ahead of time, you may be charged a no show fee.

## 2018-08-28 ENCOUNTER — Other Ambulatory Visit (HOSPITAL_COMMUNITY)
Admission: RE | Admit: 2018-08-28 | Discharge: 2018-08-28 | Disposition: A | Discharge status: Home / Self Care | Payer: No Typology Code available for payment source | Source: Ambulatory Visit | Attending: Family Medicine | Admitting: Family Medicine

## 2018-08-28 ENCOUNTER — Telehealth: Payer: Self-pay | Admitting: *Deleted

## 2018-08-28 ENCOUNTER — Encounter: Payer: Self-pay | Admitting: Family Medicine

## 2018-08-28 ENCOUNTER — Ambulatory Visit: Payer: PRIVATE HEALTH INSURANCE | Admitting: Family Medicine

## 2018-08-28 VITALS — BP 106/72 | HR 86 | Temp 98.1°F | Resp 20 | Ht 67.0 in | Wt 159.0 lb

## 2018-08-28 DIAGNOSIS — N451 Epididymitis: Secondary | ICD-10-CM

## 2018-08-28 DIAGNOSIS — N50819 Testicular pain, unspecified: Secondary | ICD-10-CM | POA: Diagnosis not present

## 2018-08-28 LAB — POC URINALSYSI DIPSTICK (AUTOMATED)
Bilirubin, UA: NEGATIVE
Blood, UA: NEGATIVE
Glucose, UA: NEGATIVE
Ketones, UA: NEGATIVE
Leukocytes, UA: NEGATIVE
Nitrite, UA: NEGATIVE
PH UA: 6 (ref 5.0–8.0)
PROTEIN UA: NEGATIVE
UROBILINOGEN UA: 0.2 U/dL

## 2018-08-28 MED ORDER — CEFTRIAXONE SODIUM 250 MG IJ SOLR
250.0000 mg | Freq: Once | INTRAMUSCULAR | Status: AC
  Administered 2018-08-28: 250 mg via INTRAMUSCULAR

## 2018-08-28 MED ORDER — DOXYCYCLINE HYCLATE 100 MG PO TABS
100.0000 mg | ORAL_TABLET | Freq: Two times a day (BID) | ORAL | 0 refills | Status: DC
Start: 2018-08-28 — End: 2018-09-25

## 2018-08-28 MED ORDER — NAPROXEN 500 MG PO TABS: 500.0000 mg | ORAL_TABLET | Freq: Two times a day (BID) | ORAL | 0 refills | Status: DC

## 2018-08-28 NOTE — Progress Notes (Signed)
Thomas Melton , 06-Jun-1996, 22 y.o., male MRN: [id number] Patient Care Team    Relationship Specialty Notifications Start End  Natalia Leatherwood, DO PCP - General Family Medicine  04/23/17     Chief Complaint  Patient presents with  . Groin Swelling    pain x 4-5 days      Subjective: Pt presents for an OV with complaints of scrotal  swelling of 4-5 days duration.  Associated symptoms include painful to touch. He states the discomfort started with an sche deep in his right pelvis. He thought it was possibly from lifting.   next day the right scrotal area became swollen and he noticed tenderness to touch a hard area on his right testicle. He states other than the swelling, if he does not touch it he is fine. He denies fever, chills, dysuria, penile lesions or discharge. The pain is only of touches the area and he has been taking NSAIDS for comfort. He is eating and drinking well. He denies changes or diffculty with BM or urinating. He does endorse noticing pain at night , but he thinks that is because he does not take advil in the evenings. He reports it also is uncomfortable if he has to push hard for a BM, but normal BM is ok. He denies new sexual partners. Pt denies trauma. He lifts weights routinely.   Depression screen [facility] 2/9 12/27/2017 07/29/2017 04/25/2017  Decreased Interest 0 0 2  Down, Depressed, Hopeless 0 0 2  PHQ - 2 Score 0 0 4  Altered sleeping 0 2 2  Tired, decreased energy 0 1 3  Change in appetite 0 0 2  Feeling bad or failure about yourself  0 0 2  Trouble concentrating 1 1 3   Moving slowly or fidgety/restless 0 0 1  Suicidal thoughts 0 0 0  PHQ-9 Score 1 4 17     No Known Allergies Social History   Tobacco Use  . Smoking status: Never Smoker  . Smokeless tobacco: Never Used  Substance Use Topics  . Alcohol use: No    Alcohol/week: 0.0 standard drinks   Past Medical History:  Diagnosis Date  . Hay fever    Just spring usually   Past Surgical History:    Procedure Laterality Date  . NO PAST SURGERIES     History reviewed. No pertinent family history. Allergies as of 08/28/2018   No Known Allergies     Medication List    as of 08/28/2018  9:32 AM   You have not been prescribed any medications.     All past medical history, surgical history, allergies, family history, immunizations andmedications were updated in the EMR today and reviewed under the history and medication portions of their EMR.     ROS: Negative, with the exception of above mentioned in HPI   Objective:  BP 106/72 (BP Location: Right Arm, Patient Position: Sitting, Cuff Size: Large)   Pulse 86   Temp 98.1 F (36.7 C)   Resp 20   Ht 5\' 7"  (1.702 m)   Wt 159 lb (72.1 kg)   SpO2 98%   BMI 24.90 kg/m  Body mass index is 24.9 kg/m. Gen: Afebrile. No acute distress. Nontoxic in appearance, well developed, well nourished. Pleasant caucasian male.  HENT: AT. [place].  MMM Eyes:Pupils Equal Round Reactive to light, Extraocular movements intact,  Conjunctiva without redness, discharge or icterus. CV: RRR .  Abd: Soft. NTND. BS present. Possible very small abd bulge/defect right groin.  rebound or guarding.  Skin: no rashes, purpura or petechiae.  Neuro:  Normal gait. PERLA. EOMi. Alert. Oriented x3  Male genitalia: no penile lesions or discharge no bladder distension noted Penis: normal, no lesions and circumcised Urethral Meatus: normal Scrotum: erythema: right and mild erythema, moderate swelling, no lesions or ulcerations. Epididymis: mass 1cm right, swelling: right and tenderness: right cremasteric reflex intact.   No exam data present No results found. Results for orders placed or performed in visit on 08/28/18 (from the past 24 hour(s))  POCT Urinalysis Dipstick (Automated)     Status: Abnormal   Collection Time: 08/28/18  9:30 AM  Result Value Ref Range   Color, UA yellow    Clarity, UA clear    Glucose, UA Negative Negative   Bilirubin, UA negative     Ketones, UA negative    Spec Grav, UA >=1.030 (A) 1.010 - 1.025   Blood, UA negative    pH, UA 6.0 5.0 - 8.0   Protein, UA Negative Negative   Urobilinogen, UA 0.2 0.2 or 1.0 E.U./dL   Nitrite, UA negative    Leukocytes, UA Negative Negative    Assessment/Plan: Thomas Melton is a 22 y.o. male present for OV for  Testicular pain/Epididymitis - Doubt torsion given timeline and exam. Epididymitis likely given severely TTP at this location. Given age treated with rocephin 250 mg Qd and doxy BID. G/C PCR obtained.  - naproxen BID prescribed.  - Can not rule out small inguinal hernia, strict return precautions discussed and he understands to seek emergent care if worsening. - POCT Urinalysis Dipstick (Automated) - Urine Culture - Cervicovaginal ancillary only - cefTRIAXone (ROCEPHIN) injection 250 mg - doxy 100 BID x 10d - F/U 1 week if not improving, 2 weeks if not resolved, urgently if worsening or fever.    Reviewed expectations re: course of current medical issues.  Discussed self-management of symptoms.  Outlined signs and symptoms indicating need for more acute intervention.  Patient verbalized understanding and all questions were answered.  Patient received an After-Visit Summary.    Orders Placed This Encounter  Procedures  . POCT Urinalysis Dipstick (Automated)     Note is dictated utilizing voice recognition software. Although note has been proof read prior to signing, occasional typographical errors still can be missed. If any questions arise, please do not hesitate to call for verification.   electronically signed by:  Felix Pacini, DO  [facility] [place]

## 2018-08-28 NOTE — Telephone Encounter (Signed)
Copied from CRM [id number]. Topic: General - Other >> Aug 28, 2018 10:15 AM Gerrianne Scale wrote: Reason for CRM: pt calling to speak with [facility] assistant about being DX with Epididymitis and he also wanted to know if the urine test was checking for any STD if not he would like to be tested for any STDs with the urine please give him a call back at [phone]

## 2018-08-28 NOTE — Patient Instructions (Addendum)
Start the doxycycline every 12 hours for 10 days. Make sure to take as directed until completely finished.  Take naproxen every 12 hours with food for at least 5 days, then as needed for pain.  No lifting for 2 weeks.  If worsening or you develop fever or  chills please be seen urgently. If not completely resolved after antibiotic, please be seen immediately.     Epididymitis Epididymitis is swelling (inflammation) of the epididymis. The epididymis is a cord-like structure that is located along the top and back part of the testicle. It collects and stores sperm from the testicle. This condition can also cause pain and swelling of the testicle and scrotum. Symptoms usually start suddenly (acute epididymitis). Sometimes epididymitis starts gradually and lasts for a while (chronic epididymitis). This type may be harder to treat. What are the causes? In men 2 and younger, this condition is usually caused by a bacterial infection or sexually transmitted disease (STD), such as:  Gonorrhea.  Chlamydia.  In men 42 and older who do not have anal sex, this condition is usually caused by bacteria from a blockage or abnormalities in the urinary system. These can result from:  Having a tube placed into the bladder (urinary catheter).  Having an enlarged or inflamed prostate gland.  Having recent urinary tract surgery.  In men who have a condition that weakens the body's defense system (immune system), such as HIV, this condition can be caused by:  Other bacteria, including tuberculosis and syphilis.  Viruses.  Fungi.  Sometimes this condition occurs without infection. That may happen if urine flows backward into the epididymis after heavy lifting or straining. What increases the risk? This condition is more likely to develop in men:  Who have unprotected sex with more than one partner.  Who have anal sex.  Who have recently had surgery.  Who have a urinary catheter.  Who have urinary  problems.  Who have a suppressed immune system.  What are the signs or symptoms? This condition usually begins suddenly with chills, fever, and pain behind the scrotum and in the testicle. Other symptoms include:  Swelling of the scrotum, testicle, or both.  Pain whenejaculatingor urinating.  Pain in the back or belly.  Nausea.  Itching and discharge from the penis.  Frequent need to pass urine.  Redness and tenderness of the scrotum.  How is this diagnosed? Your health care provider can diagnose this condition based on your symptoms and medical history. Your health care provider will also do a physical exam to ask about your symptoms and check your scrotum and testicle for swelling, pain, and redness. You may also have other tests, including:  Examination of discharge from the penis.  Urine tests for infections, such as STDs.  Your health care provider may test you for other STDs, including HIV. How is this treated? Treatment for this condition depends on the cause. If your condition is caused by a bacterial infection, oral antibiotic medicine may be prescribed. If the bacterial infection has spread to your blood, you may need to receive IV antibiotics. Nonbacterial epididymitis is treated with home care that includes bed rest and elevation of the scrotum. Surgery may be needed to treat:  Bacterial epididymitis that causes pus to build up in the scrotum (abscess).  Chronic epididymitis that has not responded to other treatments.  Follow these instructions at home: Medicines  Take over-the-counter and prescription medicines only as told by your health care provider.  If you were prescribed an  antibiotic medicine, take it as told by your health care provider. Do not stop taking the antibiotic even if your condition improves. Sexual Activity  If your epididymitis was caused by an STD, avoid sexual activity until your treatment is complete.  Inform your sexual partner or  partners if you test positive for an STD. They may need to be treated.Do not engage in sexual activity with your partner or partners until their treatment is completed. General instructions  Return to your normal activities as told by your health care provider. Ask your health care provider what activities are safe for you.  Keep your scrotum elevated and supported while resting. Ask your health care provider if you should wear a scrotal support, such as a jockstrap. Wear it as told by your health care provider.  If directed, apply ice to the affected area: ? Put ice in a plastic bag. ? Place a towel between your skin and the bag. ? Leave the ice on for 20 minutes, 2-3 times per day.  Try taking a sitz bath to help with discomfort. This is a warm water bath that is taken while you are sitting down. The water should only come up to your hips and should cover your buttocks. Do this 3-4 times per day or as told by your health care provider.  Keep all follow-up visits as told by your health care provider. This is important. Contact a health care provider if:  You have a fever.  Your pain medicine is not helping.  Your pain is getting worse.  Your symptoms do not improve within three days. This information is not intended to replace advice given to you by your health care provider. Make sure you discuss any questions you have with your health care provider. Document Released: 12/07/2000 Document Revised: 05/17/2016 Document Reviewed: 04/27/2015 Elsevier Interactive Patient Education  2018 [employer].

## 2018-08-28 NOTE — Telephone Encounter (Signed)
Spoke with patient reviewed information regarding treatment today. Patient verbalized understanding.

## 2018-08-29 ENCOUNTER — Telehealth: Payer: Self-pay | Admitting: Family Medicine

## 2018-08-29 DIAGNOSIS — F418 Other specified anxiety disorders: Secondary | ICD-10-CM

## 2018-08-29 LAB — URINE CULTURE
MICRO NUMBER:: 91063471
SPECIMEN QUALITY: ADEQUATE

## 2018-08-29 LAB — CERVICOVAGINAL ANCILLARY ONLY
CHLAMYDIA, DNA PROBE: POSITIVE — AB
NEISSERIA GONORRHEA: NEGATIVE

## 2018-08-29 NOTE — Telephone Encounter (Signed)
Spoke with patient reviewed medications prescribed for him yesterday and reasons for Rx. Patient verbalized understanding.

## 2018-08-29 NOTE — Telephone Encounter (Signed)
Pt' mother(Ellen Madonia) requested additional information be added to the original message previously sent:  She states patient seems to be tolerating pain fine with otc pain medication.  She wants to know if it is ok for patient to not take the naproxen.

## 2018-08-29 NOTE — Telephone Encounter (Signed)
Pt was called and informed of + chlamydia test. He is being treated with doxy BID and had co-tx with rocephin.  He was advised to discuss with partner. They will need treatment also. No sexual contact until fully treated + 1 week.  F/u 3 weeks for urine test of cure and provider appt for f/u on epididymitis. Pt reported understanding instructions.

## 2018-08-29 NOTE — Telephone Encounter (Signed)
Copied from CRM [id number]. Topic: Quick Communication - See Telephone Encounter >> Aug 29, 2018  2:22 PM Windy Kalata, NT wrote: CRM for notification. See Telephone encounter for: 08/29/18.  Patient mother is calling and states that she has questions in regards to naproxen (NAPROSYN) 500 MG tablet that was prescribed yesterday 08/28/18. She would like to know if this was prescribe to manage pain, as he is not in a lot of pain, or if it was also prescribed for another reason. Please contact.

## 2018-09-25 ENCOUNTER — Ambulatory Visit (INDEPENDENT_AMBULATORY_CARE_PROVIDER_SITE_OTHER): Payer: PRIVATE HEALTH INSURANCE | Admitting: Family Medicine

## 2018-09-25 ENCOUNTER — Encounter: Payer: Self-pay | Admitting: Family Medicine

## 2018-09-25 ENCOUNTER — Other Ambulatory Visit (HOSPITAL_COMMUNITY)
Admission: RE | Admit: 2018-09-25 | Discharge: 2018-09-25 | Disposition: A | Discharge status: Home / Self Care | Payer: No Typology Code available for payment source | Source: Ambulatory Visit | Attending: Family Medicine | Admitting: Family Medicine

## 2018-09-25 VITALS — BP 128/78 | HR 90 | Temp 98.0°F | Resp 20 | Ht 67.0 in | Wt 152.5 lb

## 2018-09-25 DIAGNOSIS — A749 Chlamydial infection, unspecified: Secondary | ICD-10-CM | POA: Diagnosis present

## 2018-09-25 DIAGNOSIS — F418 Other specified anxiety disorders: Secondary | ICD-10-CM

## 2018-09-25 DIAGNOSIS — F39 Unspecified mood [affective] disorder: Secondary | ICD-10-CM

## 2018-09-25 MED ORDER — FLUOXETINE HCL 40 MG PO CAPS: 40.0000 mg | ORAL_CAPSULE | Freq: Two times a day (BID) | ORAL | 1 refills | Status: AC

## 2018-09-25 NOTE — Progress Notes (Signed)
Patient ID: Thomas Melton, male  DOB: [birth year], 22 y.o.   MRN: [id number] Patient Care Team    Relationship Specialty Notifications Start End  Natalia Leatherwood, DO PCP - General Family Medicine  04/23/17     Chief Complaint  Patient presents with  . Depression  . Anxiety    Subjective:  Thomas Melton is a 22 y.o.  male present for follow up on anxiety  Depression-Anxiety/mood disorder: Pt reports he feels great. His mood is well controlled on Prozac.  He has not been taking it for the last few months again.  He would like to restart medications today.  Prior note:  Pt presents for follow up on anxiety today. He did not follow was scheduled here as requested after last appt,  because he was seen at [facility]. His prozac was increased to prozac 40 mg BID. He feels he is doing rather well on this dose. Anxiety is more controlled, sleeping better. He denies depression feelings expressed at last visit. He doe snot desire to go back to psych. He is planning to return to school in a few weeks, but will live with his parents. He is continuing to speak with counseling intermittently (family friend).   Positive chlamydia: Reports his epididymal otitis has completely resolved.  He completed all antibiotics as prescribed.  His partner was also treated for chlamydia.  Mood disorder screening 04/25/2017: Positive #1- 12 checked yes. #2- yes #3- serious problem #4 and #5- he is uncertain  Depression screen [facility] 2/9 09/25/2018 12/27/2017 07/29/2017 04/25/2017  Decreased Interest 0 0 0 2  Down, Depressed, Hopeless 1 0 0 2  PHQ - 2 Score 1 0 0 4  Altered sleeping 1 0 2 2  Tired, decreased energy 2 0 1 3  Change in appetite 3 0 0 2  Feeling bad or failure about yourself  0 0 0 2  Trouble concentrating 3 1 1 3   Moving slowly or fidgety/restless 0 0 0 1  Suicidal thoughts 0 0 0 0  PHQ-9 Score 10 1 4 17   Difficult doing work/chores Somewhat difficult - - -   GAD 7 : Generalized Anxiety Score 09/25/2018  12/27/2017 04/25/2017  Nervous, Anxious, on Edge 1 0 2  Control/stop worrying 0 0 3  Worry too much - different things 0 0 3  Trouble relaxing 0 0 1  Restless 1 0 1  Easily annoyed or irritable 2 0 2  Afraid - awful might happen 0 0 2  Total GAD 7 Score 4 0 14  Anxiety Difficulty Somewhat difficult Not difficult at all Very difficult     No flowsheet data found.  There is no immunization history on file for this patient.  No exam data present  Past Medical History:  Diagnosis Date  . Hay fever    Just spring usually   No Known Allergies Past Surgical History:  Procedure Laterality Date  . NO PAST SURGERIES     History reviewed. No pertinent family history. Social History   Socioeconomic History  . Marital status: Single    Spouse name: Not on file  . Number of children: Not on file  . Years of education: 110  . Highest education level: Not on file  Occupational History  . Occupation: Consulting civil engineer  Social Needs  . Financial resource strain: Not on file  . Food insecurity:    Worry: Not on file    Inability: Not on file  . Transportation needs:  Medical: Not on file    Non-medical: Not on file  Tobacco Use  . Smoking status: Never Smoker  . Smokeless tobacco: Never Used  Substance and Sexual Activity  . Alcohol use: No    Alcohol/week: 0.0 standard drinks  . Drug use: Yes    Types: Marijuana, Cocaine  . Sexual activity: Yes  Lifestyle  . Physical activity:    Days per week: Not on file    Minutes per session: Not on file  . Stress: Not on file  Relationships  . Social connections:    Talks on phone: Not on file    Gets together: Not on file    Attends religious service: Not on file    Active member of club or organization: Not on file    Attends meetings of clubs or organizations: Not on file    Relationship status: Not on file  . Intimate partner violence:    Fear of current or ex partner: Not on file    Emotionally abused: Not on file    Physically  abused: Not on file    Forced sexual activity: Not on file  Other Topics Concern  . Not on file  Social History Narrative   Single, no children.   [employer], [employer].      Allergies as of 09/25/2018   No Known Allergies     Medication List        Accurate as of 09/25/18 11:59 PM. Always use your most recent med list.          FLUoxetine 40 MG capsule Commonly known as:  PROZAC Take 1 capsule (40 mg total) by mouth 2 (two) times daily.       All past medical history, surgical history, allergies, family history, immunizations andmedications were updated in the EMR today and reviewed under the history and medication portions of their EMR.    Recent Results (from the past 2160 hour(s))  Cervicovaginal ancillary only     Status: Abnormal   Collection Time: 08/28/18 12:00 AM  Result Value Ref Range   Chlamydia **POSITIVE** (A)     Comment: Normal Reference Range - Negative   Neisseria gonorrhea Negative     Comment: Normal Reference Range - Negative  POCT Urinalysis Dipstick (Automated)     Status: Abnormal   Collection Time: 08/28/18  9:30 AM  Result Value Ref Range   Color, UA yellow    Clarity, UA clear    Glucose, UA Negative Negative   Bilirubin, UA negative    Ketones, UA negative    Spec Grav, UA >=1.030 (A) 1.010 - 1.025   Blood, UA negative    pH, UA 6.0 5.0 - 8.0   Protein, UA Negative Negative   Urobilinogen, UA 0.2 0.2 or 1.0 E.U./dL   Nitrite, UA negative    Leukocytes, UA Negative Negative  Urine Culture     Status: None   Collection Time: 08/28/18  2:05 PM  Result Value Ref Range   MICRO NUMBER: [id number]    SPECIMEN QUALITY: ADEQUATE    Sample Source NOT GIVEN    STATUS: FINAL    Result:      Multiple organisms present, each less than 10,000 CFU/mL. These organisms, commonly found on external and internal genitalia, are considered to be colonizers. No further testing performed.  Urine cytology ancillary only     Status: None     Collection Time: 09/25/18 12:00 AM  Result Value Ref Range   Chlamydia  Negative     Comment: Normal Reference Range - Negative   Neisseria gonorrhea Negative     Comment: Normal Reference Range - Negative    No results found.   ROS: 14 pt review of systems performed and negative (unless mentioned in an HPI)  Objective: BP 128/78 (BP Location: Right Arm, Patient Position: Sitting, Cuff Size: Normal)   Pulse 90   Temp 98 F (36.7 C)   Resp 20   Ht 5\' 7"  (1.702 m)   Wt 152 lb 8 oz (69.2 kg)   SpO2 98%   BMI 23.88 kg/m  Gen: Afebrile. No acute distress.  HENT: AT. [place].MMM.  Eyes:Pupils Equal Round Reactive to light, Extraocular movements intact,  Conjunctiva without redness, discharge or icterus. Neuro:  Normal gait. PERLA. EOMi. Alert. Oriented.  Psych: Normal affect, dress and demeanor. Normal speech. Normal thought content and judgment.    Assessment/plan: Thomas Melton is a 22 y.o. male present for anxiety. Depression with anxiety Mood disorder (HCC) -Been doing well on Prozac 40 mg twice daily however he stopped using it on his own.  He would like to restart today.  Discussed restarting by taper, instructions provided. - continue prozac at 40 mg BID, refills provided today for 6 months.   - F/u 6 months, unless needed sooner.   Chlamydia: -Post antibiotic treatment, symptoms have improved.  Urine cytology for test of cure completed today.   Return in about 6 months (around 03/27/2019) for depression -anxiety.  Note is dictated utilizing voice recognition software. Although note has been proof read prior to signing, occasional typographical errors still can be missed. If any questions arise, please do not hesitate to call for verification.  Electronically signed by: Felix Pacini, DO [facility] [place]

## 2018-09-25 NOTE — Patient Instructions (Addendum)
Start prozac 1 pill a day for 7 days, then go back to your usual dosing of twice a day.  We will call you with urine results once available.    Generalized Anxiety Disorder, Adult Generalized anxiety disorder (GAD) is a mental health disorder. People with this condition constantly worry about everyday events. Unlike normal anxiety, worry related to GAD is not triggered by a specific event. These worries also do not fade or get better with time. GAD interferes with life functions, including relationships, work, and school. GAD can vary from mild to severe. People with severe GAD can have intense waves of anxiety with physical symptoms (panic attacks). What are the causes? The exact cause of GAD is not known. What increases the risk? This condition is more likely to develop in:  Women.  People who have a family history of anxiety disorders.  People who are very shy.  People who experience very stressful life events, such as the death of a loved one.  People who have a very stressful family environment.  What are the signs or symptoms? People with GAD often worry excessively about many things in their lives, such as their health and family. They may also be overly concerned about:  Doing well at work.  Being on time.  Natural disasters.  Friendships.  Physical symptoms of GAD include:  Fatigue.  Muscle tension or having muscle twitches.  Trembling or feeling shaky.  Being easily startled.  Feeling like your heart is pounding or racing.  Feeling out of breath or like you cannot take a deep breath.  Having trouble falling asleep or staying asleep.  Sweating.  Nausea, diarrhea, or irritable bowel syndrome (IBS).  Headaches.  Trouble concentrating or remembering facts.  Restlessness.  Irritability.  How is this diagnosed? Your health care provider can diagnose GAD based on your symptoms and medical history. You will also have a physical exam. The health care  provider will ask specific questions about your symptoms, including how severe they are, when they started, and if they come and go. Your health care provider may ask you about your use of alcohol or drugs, including prescription medicines. Your health care provider may refer you to a mental health specialist for further evaluation. Your health care provider will do a thorough examination and may perform additional tests to rule out other possible causes of your symptoms. To be diagnosed with GAD, a person must have anxiety that:  Is out of his or her control.  Affects several different aspects of his or her life, such as work and relationships.  Causes distress that makes him or her unable to take part in normal activities.  Includes at least three physical symptoms of GAD, such as restlessness, fatigue, trouble concentrating, irritability, muscle tension, or sleep problems.  Before your health care provider can confirm a diagnosis of GAD, these symptoms must be present more days than they are not, and they must last for six months or longer. How is this treated? The following therapies are usually used to treat GAD:  Medicine. Antidepressant medicine is usually prescribed for long-term daily control. Antianxiety medicines may be added in severe cases, especially when panic attacks occur.  Talk therapy (psychotherapy). Certain types of talk therapy can be helpful in treating GAD by providing support, education, and guidance. Options include: ? Cognitive behavioral therapy (CBT). People learn coping skills and techniques to ease their anxiety. They learn to identify unrealistic or negative thoughts and behaviors and to replace them with  positive ones. ? Acceptance and commitment therapy (ACT). This treatment teaches people how to be mindful as a way to cope with unwanted thoughts and feelings. ? Biofeedback. This process trains you to manage your body's response (physiological response) through  breathing techniques and relaxation methods. You will work with a therapist while machines are used to monitor your physical symptoms.  Stress management techniques. These include yoga, meditation, and exercise.  A mental health specialist can help determine which treatment is best for you. Some people see improvement with one type of therapy. However, other people require a combination of therapies. Follow these instructions at home:  Take over-the-counter and prescription medicines only as told by your health care provider.  Try to maintain a normal routine.  Try to anticipate stressful situations and allow extra time to manage them.  Practice any stress management or self-calming techniques as taught by your health care provider.  Do not punish yourself for setbacks or for not making progress.  Try to recognize your accomplishments, even if they are small.  Keep all follow-up visits as told by your health care provider. This is important. Contact a health care provider if:  Your symptoms do not get better.  Your symptoms get worse.  You have signs of depression, such as: ? A persistently sad, cranky, or irritable mood. ? Loss of enjoyment in activities that used to bring you joy. ? Change in weight or eating. ? Changes in sleeping habits. ? Avoiding friends or family members. ? Loss of energy for normal tasks. ? Feelings of guilt or worthlessness. Get help right away if:  You have serious thoughts about hurting yourself or others. If you ever feel like you may hurt yourself or others, or have thoughts about taking your own life, get help right away. You can go to your nearest emergency department or call:  Your local emergency services (911 in the [place]  A suicide crisis helpline, such as the National Suicide Prevention Lifeline at 702-522-5513. This is open 24 hours a day.  Summary  Generalized anxiety disorder (GAD) is a mental health disorder that involves worry  that is not triggered by a specific event.  People with GAD often worry excessively about many things in their lives, such as their health and family.  GAD may cause physical symptoms such as restlessness, trouble concentrating, sleep problems, frequent sweating, nausea, diarrhea, headaches, and trembling or muscle twitching.  A mental health specialist can help determine which treatment is best for you. Some people see improvement with one type of therapy. However, other people require a combination of therapies. This information is not intended to replace advice given to you by your health care provider. Make sure you discuss any questions you have with your health care provider. Document Released: 04/06/2013 Document Revised: 10/30/2016 Document Reviewed: 10/30/2016 Elsevier Interactive Patient Education  2018 [employer]. Depressive Disorder, Adult Major depressive disorder (MDD) is a mental health condition. MDD often makes you feel sad, hopeless, or helpless. MDD can also cause symptoms in your body. MDD can affect your:  Work.  School.  Relationships.  Other normal activities.  MDD can range from mild to very bad. It may occur once (single episode MDD). It can also occur many times (recurrent MDD). The main symptoms of MDD often include:  Feeling sad, depressed, or irritable most of the time.  Loss of interest.  MDD symptoms also include:  Sleeping too much or too little.  Eating too much or too little.  A  change in your weight.  Feeling tired (fatigue) or having low energy.  Feeling worthless.  Feeling guilty.  Trouble making decisions.  Trouble thinking clearly.  Thoughts of suicide or harming others.  Feeling weak.  Feeling agitated.  Keeping yourself from being around other people (isolation).  Follow these instructions at home: Activity  Do these things as told by your doctor: ? Go back to your normal activities. ? Exercise regularly. ? Spend  time outdoors. Alcohol  Talk with your doctor about how alcohol can affect your antidepressant medicines.  Do not drink alcohol. Or, limit how much alcohol you drink. ? This means no more than 1 drink a day for nonpregnant women and 2 drinks a day for men. One drink equals one of these:  12 oz of beer.  5 oz of wine.  1 oz of hard liquor. General instructions  Take over-the-counter and prescription medicines only as told by your doctor.  Eat a healthy diet.  Get plenty of sleep.  Find activities that you enjoy. Make time to do them.  Think about joining a support group. Your doctor may be able to suggest a group for you.  Keep all follow-up visits as told by your doctor. This is important. Where to find more information:  The [employer] on Mental Illness: ? www.nami.org  [place] [employer] of Mental Health: ? http://www.maynard.net/  National Suicide Prevention Lifeline: ? 336-119-1312. This is free, 24-hour help. Contact a doctor if:  Your symptoms get worse.  You have new symptoms. Get help right away if:  You self-harm.  You see, hear, taste, smell, or feel things that are not present (hallucinate). If you ever feel like you may hurt yourself or others, or have thoughts about taking your own life, get help right away. You can go to your nearest emergency department or call:  Your local emergency services (911 in the [place]  A suicide crisis helpline, such as the National Suicide Prevention Lifeline: ? 873-706-7078. This is open 24 hours a day.  This information is not intended to replace advice given to you by your health care provider. Make sure you discuss any questions you have with your health care provider. Document Released: 11/21/2015 Document Revised: 08/26/2016 Document Reviewed: 08/26/2016 Elsevier Interactive Patient Education  2017 [employer].

## 2018-09-26 LAB — URINE CYTOLOGY ANCILLARY ONLY
Chlamydia: NEGATIVE
Neisseria Gonorrhea: NEGATIVE

## 2018-09-29 ENCOUNTER — Encounter: Payer: Self-pay | Admitting: Family Medicine

## 2018-10-02 ENCOUNTER — Telehealth: Payer: Self-pay | Admitting: Family Medicine

## 2018-10-02 NOTE — Telephone Encounter (Signed)
Spoke with patient reviewed lab results. 

## 2018-10-02 NOTE — Telephone Encounter (Signed)
Copied from CRM [id number]. Topic: Quick Communication - See Telephone Encounter >> Oct 02, 2018  2:36 PM Terisa Starr wrote: CRM for notification. See Telephone encounter for: 10/02/18.  Patient would like a call back regarding lab results from 10/3

## 2018-10-11 ENCOUNTER — Emergency Department (HOSPITAL_COMMUNITY)
Admission: EM | Admit: 2018-10-11 | Discharge: 2018-10-11 | Disposition: A | Discharge status: Home / Self Care | Payer: No Typology Code available for payment source | Attending: Emergency Medicine | Admitting: Emergency Medicine

## 2018-10-11 DIAGNOSIS — W25XXXA Contact with sharp glass, initial encounter: Secondary | ICD-10-CM | POA: Diagnosis not present

## 2018-10-11 DIAGNOSIS — Y92214 College as the place of occurrence of the external cause: Secondary | ICD-10-CM | POA: Insufficient documentation

## 2018-10-11 DIAGNOSIS — Y999 Unspecified external cause status: Secondary | ICD-10-CM | POA: Insufficient documentation

## 2018-10-11 DIAGNOSIS — S0990XA Unspecified injury of head, initial encounter: Secondary | ICD-10-CM

## 2018-10-11 DIAGNOSIS — S0181XA Laceration without foreign body of other part of head, initial encounter: Secondary | ICD-10-CM | POA: Diagnosis not present

## 2018-10-11 DIAGNOSIS — Y9389 Activity, other specified: Secondary | ICD-10-CM | POA: Diagnosis not present

## 2018-10-11 DIAGNOSIS — Z79899 Other long term (current) drug therapy: Secondary | ICD-10-CM | POA: Insufficient documentation

## 2018-10-11 MED ORDER — OXYCODONE-ACETAMINOPHEN 5-325 MG PO TABS
2.0000 | ORAL_TABLET | Freq: Once | ORAL | Status: AC
  Administered 2018-10-11: 2 via ORAL
  Filled 2018-10-11: qty 2

## 2018-10-11 MED ORDER — LIDOCAINE-EPINEPHRINE (PF) 2 %-1:200000 IJ SOLN
10.0000 mL | Freq: Once | INTRAMUSCULAR | Status: AC
  Administered 2018-10-11: 10 mL

## 2018-10-11 MED ORDER — LIDOCAINE-EPINEPHRINE (PF) 2 %-1:200000 IJ SOLN
20.0000 mL | Freq: Once | INTRAMUSCULAR | Status: DC
  Filled 2018-10-11: qty 20

## 2018-10-11 MED ORDER — ONDANSETRON 4 MG PO TBDP
4.0000 mg | ORAL_TABLET | Freq: Once | ORAL | Status: AC
  Administered 2018-10-11: 4 mg via ORAL
  Filled 2018-10-11: qty 1

## 2018-10-11 MED ORDER — LIDOCAINE-EPINEPHRINE-TETRACAINE (LET) SOLUTION
3.0000 mL | Freq: Once | NASAL | Status: AC
  Administered 2018-10-11: 3 mL via TOPICAL
  Filled 2018-10-11: qty 3

## 2018-10-11 MED ORDER — LIDOCAINE-EPINEPHRINE 1 %-1:100000 IJ SOLN: 10.0000 mL | Freq: Once | INTRAMUSCULAR | Status: DC

## 2018-10-11 NOTE — Discharge Instructions (Addendum)
I have placed 10 stitches on the side of your face and on your ear. Keep these in for 10 days until Monday 10/20/18. You may follow-up with student health or an urgent care to get these removed. Follow-up with a medical provider if you have one or more of the following symptoms: fever; increased redness, warmth or tenderness at the wound site; pain beyond where the initial wound was; unusual discharge.  Do not be surprised if you are more sore and have a worse headache tomorrow. You may also experience symptoms of a concussion. For pain, you may use Tylenol and/or Ibuprofen.  Thank you for allowing me to take care of you today. Be safe the rest of homecoming and good luck with school!
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
## 2018-10-11 NOTE — ED Notes (Signed)
Patient transported to CT 

## 2020-01-20 ENCOUNTER — Ambulatory Visit: Payer: PRIVATE HEALTH INSURANCE | Admitting: Family Medicine

## 2020-01-20 ENCOUNTER — Telehealth: Payer: Self-pay | Admitting: Family Medicine

## 2020-01-20 ENCOUNTER — Other Ambulatory Visit: Payer: Self-pay

## 2020-01-20 ENCOUNTER — Encounter: Payer: Self-pay | Admitting: Family Medicine

## 2020-01-20 VITALS — BP 113/74 | HR 109 | Temp 98.4°F | Resp 16 | Ht 67.0 in | Wt 165.0 lb

## 2020-01-20 DIAGNOSIS — R1031 Right lower quadrant pain: Secondary | ICD-10-CM

## 2020-01-20 DIAGNOSIS — R109 Unspecified abdominal pain: Secondary | ICD-10-CM

## 2020-01-20 MED ORDER — ONDANSETRON HCL 4 MG PO TABS: 4.0000 mg | ORAL_TABLET | Freq: Three times a day (TID) | ORAL | 0 refills | Status: AC | PRN

## 2020-01-20 MED ORDER — METRONIDAZOLE 500 MG PO TABS: 500.0000 mg | ORAL_TABLET | Freq: Three times a day (TID) | ORAL | 0 refills | Status: AC

## 2020-01-20 MED ORDER — LEVOFLOXACIN 750 MG PO TABS: 750.0000 mg | ORAL_TABLET | Freq: Every day | ORAL | 0 refills | Status: AC

## 2020-01-20 NOTE — Telephone Encounter (Signed)
Pt was called and given information. Scheduled at 4pm. Pt agreed to go to ED if pain worsens or any other symptoms start.

## 2020-01-20 NOTE — Patient Instructions (Addendum)
Report to ED immediatly if condition worsens before we are able to get image and labs completed.  Concern for acute abd and appendicitis. - clear liquid diet only.  - zofran prescribed for nausea  - Levaquin and flagyl start ASAP- called into your pharmacy. These are antibiotics.     Appendicitis, Adult  The appendix is a tube in the body that is shaped like a finger. It is attached to the large intestine. Appendicitis means that this tube is swollen (inflamed). If this is not treated, the tube can tear (rupture). This can lead to a life-threatening infection. This condition can also cause pus to build up in the appendix (abscess). What are the causes? This condition may be caused by something that blocks the appendix. These include:  A ball of poop (stool).  Lymph glands that are bigger than normal. Sometimes the cause is not known. What increases the risk? You are more likely to develop this condition if you are between 60 and 28 years of age. What are the signs or symptoms? Symptoms of this condition include:  Pain around the belly button (navel). ? The pain moves toward the lower right belly (abdomen). ? The pain can get worse with time. ? The pain can get worse if you cough. ? The pain can get worse if you move suddenly.  Tenderness in the lower right belly.  Feeling sick to your stomach (nauseous).  Throwing up (vomiting).  Not feeling hungry (loss of appetite).  A fever.  Having trouble pooping (constipation).  Watery poop (diarrhea).  Not feeling well. How is this treated? Most often, this condition is treated by taking out the appendix (appendectomy). There are two ways to do this:  Open surgery. For this method, the appendix is taken out through a large cut (incision). The cut is made in the lower right belly. This surgery may be used if: ? You have scars from another surgery. ? You have a bleeding condition. ? You are pregnant and will be having your baby  soon. ? You have a condition that makes it hard to do the other type of surgery.  Laparoscopic surgery. For this method, the appendix is taken out through small cuts. Often, this surgery: ? Causes less pain. ? Causes fewer problems. ? Is easier to heal from. If your appendix tears and pus forms:  A drain may be put into the sore. The drain will be used to get rid of the pus.  You may get an antibiotic medicine through an IV line.  Your appendix may or may not need to be taken out. Follow these instructions at home: If you had surgery, follow instructions from your doctor on how to care for yourself at home and how to take care of your cut from surgery. Medicines  Take over-the-counter and prescription medicines only as told by your doctor.  If you were prescribed an antibiotic medicine, take it as told by your doctor. Do not stop taking the antibiotic even if you start to feel better. Eating and drinking Follow instructions from your doctor about what you cannot eat or drink. You may go back to your diet slowly if:  You no longer feel sick to your stomach.  You have stopped throwing up. General instructions  Do not use any products that contain nicotine or tobacco, such as cigarettes, e-cigarettes, and chewing tobacco. If you need help quitting, ask your doctor.  Do not drive or use heavy machinery while taking prescription pain medicine.  Ask your doctor if the medicine you are taking can cause trouble pooping. You may need to take steps to prevent or treat trouble pooping: ? Drink enough fluid to keep your pee (urine) pale yellow. ? Take over-the-counter or prescription medicines. ? Eat foods that are high in fiber. These include beans, whole grains, and fresh fruits and vegetables. ? Limit foods that are high in fat and sugar. These include fried or sweet foods.  Keep all follow-up visits as told by your doctor. This is important. Contact a doctor if:  There is pus,  blood, or a lot of fluid coming from your cut or cuts from surgery.  You are sick to your stomach or you throw up. Get help right away if:  You have pain in your belly, and the pain is getting worse.  You have a fever.  You have chills.  You are very tired.  You have muscle pain.  You are short of breath. Summary  Appendicitis is swelling of the appendix. The appendix is a tube that is shaped like a finger. It is joined to the large intestine.  This condition may be caused by something that blocks the appendix. This can lead to an infection.  This condition is usually treated by taking out the appendix. This information is not intended to replace advice given to you by your health care provider. Make sure you discuss any questions you have with your health care provider. Document Revised: 05/28/2018 Document Reviewed: 05/28/2018 Elsevier Patient Education  [place].

## 2020-01-20 NOTE — Telephone Encounter (Signed)
Ok to place in the 4 pm slot (30 min) If pain worsens before then- he should go to ED.  We can collect labs, order image studies etc if needed> but those are going to take time to receive back in the outpatient setting. If pain is severe, nausea, vomit or fever> this could be appendicitis or colitis.

## 2020-01-20 NOTE — Telephone Encounter (Signed)
Mom calling about son. Verified PHI. Mom reports he is having abdominal pain since Monday.  He is having loose stools but that is nothing new for him.  She is requesting appt, Dr. Claiborne Billings today, Dr. Claiborne Billings only has the 4pm available. Is this okay to schedule in this time slot? Or can we work in at another time.  Please call Mom [phone]

## 2020-01-20 NOTE — Telephone Encounter (Signed)
No fever, Loose stools w/ no blood seen by pt, lower abd pain that is Sharp. No N/V.  Pt reports It has gotten worse since Monday. Pt was unable to have BM all day yesterday, was in severe pain, took laxatives and drank water all day. Woke him up from his sleep last night a couple of times.Had BM, that was loose, this AM ad he states he does feel a lot better but still has slight sharp abd pain. COVID screening done and answered no besides the loose stools.   Please advise if patient can be placed on at 4pm and if it needs to be in office

## 2020-01-20 NOTE — Progress Notes (Addendum)
This visit occurred during the SARS-CoV-2 public health emergency.  Safety protocols were in place, including screening questions prior to the visit, additional usage of staff PPE, and extensive cleaning of exam room while observing appropriate contact time as indicated for disinfecting solutions.    Thomas Melton , 02/03/1996, 24 y.o., male MRN: [id number] Patient Care Team    Relationship Specialty Notifications Start End  Ma Hillock, DO PCP - General Family Medicine  10/11/18     Chief Complaint  Patient presents with  . Abdominal Pain    since Monday     Subjective: Pt presents for an OV with complaints of lower abd pain since Monday. It reports it started with a sharp shooting pain around his umbilicus.He had an episode of diarrhea Monday.  He has since experienced abd pressure and inability to have a BM. He took a laxative and had a loose stool twice today and felt some improvement in symptoms. He denies fever, chills, nausea or vomit. He denies BRPR. He denies ever having abd pain like this in the past.   Depression screen [facility] 2/9 09/25/2018 12/27/2017 07/29/2017 04/25/2017  Decreased Interest 0 0 0 2  Down, Depressed, Hopeless 1 0 0 2  PHQ - 2 Score 1 0 0 4  Altered sleeping 1 0 2 2  Tired, decreased energy 2 0 1 3  Change in appetite 3 0 0 2  Feeling bad or failure about yourself  0 0 0 2  Trouble concentrating _0 Moving slowly or fidgety/restless 0 0 0 1  Suicidal thoughts 0 0 0 0  PHQ-9 Score _1 Difficult doing work/chores Somewhat difficult - - -    No Known Allergies Social History   Social History Narrative   Single, no children.   [employer] claims handler.      Past Medical History:  Diagnosis Date  . Hay fever    Just spring usually   Past Surgical History:  Procedure Laterality Date  . NO PAST SURGERIES     History reviewed. No pertinent family history. Allergies as of 01/20/2020   No Known Allergies       Medication List       Accurate as of January 20, 2020  4:44 PM. If you have any questions, ask your nurse or doctor.        FLUoxetine 40 MG capsule Commonly known as: PROZAC Take 1 capsule (40 mg total) by mouth 2 (two) times daily.   levofloxacin 750 MG tablet Commonly known as: Levaquin Take 1 tablet (750 mg total) by mouth daily. Started by: Howard Pouch, DO   metroNIDAZOLE 500 MG tablet Commonly known as: FLAGYL Take 1 tablet (500 mg total) by mouth 3 (three) times daily. Started by: Howard Pouch, DO   ondansetron 4 MG tablet Commonly known as: ZOFRAN Take 1 tablet (4 mg total) by mouth every 8 (eight) hours as needed for nausea or vomiting. Started by: Howard Pouch, DO       All past medical history, surgical history, allergies, family history, immunizations andmedications were updated in the EMR today and reviewed under the history and medication portions of their EMR.     ROS: Negative, with the exception of above mentioned in HPI   Objective:  BP 113/74 (BP Location: Left Arm, Patient Position: Sitting, Cuff Size: Normal)   Pulse (!) 109   Temp 98.4 F (36.9 C) (Temporal)   Resp 16  Ht _0  (1.702 m)   Wt 165 lb (74.8 kg)   SpO2 98%   BMI 25.84 kg/m  Body mass index is 25.84 kg/m. Gen: Afebrile. No acute distress. Nontoxic in appearance, well developed, well nourished.  HENT: AT. [place].  Eyes:Pupils Equal Round Reactive to light, Extraocular movements intact,  Conjunctiva without redness, discharge or icterus. CV: Tachycardic Chest: CTAB, no wheeze or crackles. Good air movement, normal resp effort.  Abd: Soft.Taut RLQ. ND. Severely TTP RLQ. + McBurneys. BS hyperactive  Masses palpated. No rebound or guarding. Became nauseated with palpation.  Skin: no rashes, purpura or petechiae.  Neuro: Normal gait. PERLA. EOMi. Alert. Oriented x3  Psych: Normal affect, dress and demeanor. Normal speech. Normal thought content and judgment.  No exam data  present No results found. No results found for this or any previous visit (from the past 24 hour(s)).  Assessment/Plan: Chauncy Mangiaracina is a 24 y.o. male present for OV for  RLQ abdominal pain/abd pain Concern for acute abd and appendicitis. Discussed this with pt today and urged him to report to ED immediatly if condition worsens before we are able to get image and labs completed.  - liquid diet only.  - zofran for nausea - Levaquin and metro start ASAP - CT Abdomen Pelvis W Contrast; Future- STAT>>being arranged.  - Sedimentation rate - CBC w/Diff - Comp Met (CMET) - C-reactive protein - emergent precautions stressed to him>> he reports understanding.  - if labs return critical- pt advised he would be instructed to report to ED. He reports understanding.   Reviewed expectations re: course of current medical issues.  Discussed self-management of symptoms.  Outlined signs and symptoms indicating need for more acute intervention.  Patient verbalized understanding and all questions were answered.  Patient received an After-Visit Summary.    Orders Placed This Encounter  Procedures  . CT Abdomen Pelvis W Contrast  . Sedimentation rate  . CBC w/Diff  . Comp Met (CMET)  . C-reactive protein   Meds ordered this encounter  Medications  . ondansetron (ZOFRAN) 4 MG tablet    Sig: Take 1 tablet (4 mg total) by mouth every 8 (eight) hours as needed for nausea or vomiting.    Dispense:  20 tablet    Refill:  0  . levofloxacin (LEVAQUIN) 750 MG tablet    Sig: Take 1 tablet (750 mg total) by mouth daily.    Dispense:  7 tablet    Refill:  0  . metroNIDAZOLE (FLAGYL) 500 MG tablet    Sig: Take 1 tablet (500 mg total) by mouth 3 (three) times daily.    Dispense:  21 tablet    Refill:  0    Note is dictated utilizing voice recognition software. Although note has been proof read prior to signing, occasional typographical errors still can be missed. If any questions arise, please do  not hesitate to call for verification.   electronically signed by:  Howard Pouch, DO  [place]

## 2020-01-21 ENCOUNTER — Telehealth: Payer: Self-pay | Admitting: Family Medicine

## 2020-01-21 LAB — COMPREHENSIVE METABOLIC PANEL
AG Ratio: 1.7 (calc) (ref 1.0–2.5)
ALT: 15 U/L (ref 9–46)
AST: 16 U/L (ref 10–40)
Albumin: 4.4 g/dL (ref 3.6–5.1)
Alkaline phosphatase (APISO): 63 U/L (ref 36–130)
BUN: 11 mg/dL (ref 7–25)
CO2: 31 mmol/L (ref 20–32)
Calcium: 9.4 mg/dL (ref 8.6–10.3)
Chloride: 103 mmol/L (ref 98–110)
Creat: 1.13 mg/dL (ref 0.60–1.35)
Globulin: 2.6 g/dL (calc) (ref 1.9–3.7)
Glucose, Bld: 91 mg/dL (ref 65–99)
Potassium: 4 mmol/L (ref 3.5–5.3)
Sodium: 142 mmol/L (ref 135–146)
Total Bilirubin: 0.4 mg/dL (ref 0.2–1.2)
Total Protein: 7 g/dL (ref 6.1–8.1)

## 2020-01-21 LAB — CBC WITH DIFFERENTIAL/PLATELET
Absolute Monocytes: 1037 cells/uL — ABNORMAL HIGH (ref 200–950)
Basophils Absolute: 29 cells/uL (ref 0–200)
Basophils Relative: 0.2 %
Eosinophils Absolute: 73 cells/uL (ref 15–500)
Eosinophils Relative: 0.5 %
HCT: 42.5 % (ref 38.5–50.0)
Hemoglobin: 14.1 g/dL (ref 13.2–17.1)
Lymphs Abs: 2190 cells/uL (ref 850–3900)
MCH: 28 pg (ref 27.0–33.0)
MCHC: 33.2 g/dL (ref 32.0–36.0)
MCV: 84.3 fL (ref 80.0–100.0)
MPV: 10.6 fL (ref 7.5–12.5)
Monocytes Relative: 7.1 %
Neutro Abs: 11271 cells/uL — ABNORMAL HIGH (ref 1500–7800)
Neutrophils Relative %: 77.2 %
Platelets: 282 10*3/uL (ref 140–400)
RBC: 5.04 10*6/uL (ref 4.20–5.80)
RDW: 12.6 % (ref 11.0–15.0)
Total Lymphocyte: 15 %
WBC: 14.6 10*3/uL — ABNORMAL HIGH (ref 3.8–10.8)

## 2020-01-21 LAB — C-REACTIVE PROTEIN: CRP: 75.2 mg/L — ABNORMAL HIGH (ref <8.0)

## 2020-01-21 LAB — SEDIMENTATION RATE

## 2020-01-21 NOTE — Telephone Encounter (Signed)
Attempted to call patient this morning to inform him of lab results-his phone went straight to voicemail.  Therefore his emergency contact was contacted which was his mother Thomas Melton.  Mrs. Boney was informed of lab results.  Thomas Melton lives with her.  She understood recommendations for them  to report to ED immediately and will take him there now.  -strongly suspect appendicitis,  WBC elevation of 14.6 (with left shift) and a CRP of 75.2.  Exam was consistent with appendicitis.  A STAT CT outpatient was not able to be approved yesterday. Per his insurance rep and required radiology center- they could not get a CT approved and scheduled yesterday since patient presented late afternoon.   Electronically Signed by: Felix Pacini, DO [facility] [place]

## 2023-11-05 ENCOUNTER — Ambulatory Visit: Payer: BLUE CROSS/BLUE SHIELD | Admitting: Internal Medicine
